# Patient Record
Sex: Female | Born: 1945 | Race: Black or African American | Hispanic: No | Smoking: Current every day smoker
Health system: Southern US, Community
[De-identification: ages and names within clinical notes are randomized; demographics above are authoritative.]

## PROBLEM LIST (undated history)

## (undated) DIAGNOSIS — I4891 Unspecified atrial fibrillation: Secondary | ICD-10-CM

## (undated) DIAGNOSIS — I420 Dilated cardiomyopathy: Secondary | ICD-10-CM

## (undated) DIAGNOSIS — I82409 Acute embolism and thrombosis of unspecified deep veins of unspecified lower extremity: Secondary | ICD-10-CM

## (undated) DIAGNOSIS — I10 Essential (primary) hypertension: Secondary | ICD-10-CM

## (undated) DIAGNOSIS — E119 Type 2 diabetes mellitus without complications: Secondary | ICD-10-CM

## (undated) DIAGNOSIS — I2510 Atherosclerotic heart disease of native coronary artery without angina pectoris: Secondary | ICD-10-CM

## (undated) DIAGNOSIS — K921 Melena: Secondary | ICD-10-CM

## (undated) DIAGNOSIS — E785 Hyperlipidemia, unspecified: Secondary | ICD-10-CM

## (undated) DIAGNOSIS — G4733 Obstructive sleep apnea (adult) (pediatric): Secondary | ICD-10-CM

## (undated) DIAGNOSIS — Z95810 Presence of automatic (implantable) cardiac defibrillator: Secondary | ICD-10-CM

## (undated) DIAGNOSIS — K219 Gastro-esophageal reflux disease without esophagitis: Secondary | ICD-10-CM

## (undated) DIAGNOSIS — I509 Heart failure, unspecified: Secondary | ICD-10-CM

## (undated) DIAGNOSIS — G40909 Epilepsy, unspecified, not intractable, without status epilepticus: Secondary | ICD-10-CM

## (undated) HISTORY — DX: Melena: K92.1

---

## 2013-03-19 ENCOUNTER — Inpatient Hospital Stay (HOSPITAL_COMMUNITY)
Admission: EM | Admit: 2013-03-19 | Discharge: 2013-03-25 | DRG: 302 | Disposition: A | Discharge status: Home / Self Care | Payer: Medicare Other | Attending: Internal Medicine | Admitting: Internal Medicine

## 2013-03-19 ENCOUNTER — Emergency Department (HOSPITAL_COMMUNITY): Payer: Medicare Other

## 2013-03-19 ENCOUNTER — Encounter (HOSPITAL_COMMUNITY): Payer: Self-pay | Admitting: Emergency Medicine

## 2013-03-19 DIAGNOSIS — E785 Hyperlipidemia, unspecified: Secondary | ICD-10-CM | POA: Diagnosis present

## 2013-03-19 DIAGNOSIS — G40909 Epilepsy, unspecified, not intractable, without status epilepticus: Secondary | ICD-10-CM | POA: Diagnosis present

## 2013-03-19 DIAGNOSIS — Z720 Tobacco use: Secondary | ICD-10-CM | POA: Diagnosis present

## 2013-03-19 DIAGNOSIS — E872 Acidosis, unspecified: Secondary | ICD-10-CM | POA: Diagnosis present

## 2013-03-19 DIAGNOSIS — F17200 Nicotine dependence, unspecified, uncomplicated: Secondary | ICD-10-CM | POA: Diagnosis present

## 2013-03-19 DIAGNOSIS — I5023 Acute on chronic systolic (congestive) heart failure: Secondary | ICD-10-CM | POA: Diagnosis present

## 2013-03-19 DIAGNOSIS — N186 End stage renal disease: Secondary | ICD-10-CM | POA: Diagnosis present

## 2013-03-19 DIAGNOSIS — E662 Morbid (severe) obesity with alveolar hypoventilation: Secondary | ICD-10-CM

## 2013-03-19 DIAGNOSIS — R45851 Suicidal ideations: Secondary | ICD-10-CM

## 2013-03-19 DIAGNOSIS — G4733 Obstructive sleep apnea (adult) (pediatric): Secondary | ICD-10-CM | POA: Diagnosis present

## 2013-03-19 DIAGNOSIS — I509 Heart failure, unspecified: Secondary | ICD-10-CM | POA: Diagnosis present

## 2013-03-19 DIAGNOSIS — Z91199 Patient's noncompliance with other medical treatment and regimen due to unspecified reason: Secondary | ICD-10-CM

## 2013-03-19 DIAGNOSIS — Z86718 Personal history of other venous thrombosis and embolism: Secondary | ICD-10-CM

## 2013-03-19 DIAGNOSIS — I4891 Unspecified atrial fibrillation: Secondary | ICD-10-CM | POA: Diagnosis present

## 2013-03-19 DIAGNOSIS — I428 Other cardiomyopathies: Secondary | ICD-10-CM | POA: Diagnosis present

## 2013-03-19 DIAGNOSIS — I2510 Atherosclerotic heart disease of native coronary artery without angina pectoris: Principal | ICD-10-CM | POA: Diagnosis present

## 2013-03-19 DIAGNOSIS — Z95810 Presence of automatic (implantable) cardiac defibrillator: Secondary | ICD-10-CM

## 2013-03-19 DIAGNOSIS — G9340 Encephalopathy, unspecified: Secondary | ICD-10-CM | POA: Diagnosis present

## 2013-03-19 DIAGNOSIS — R0689 Other abnormalities of breathing: Secondary | ICD-10-CM

## 2013-03-19 DIAGNOSIS — R079 Chest pain, unspecified: Secondary | ICD-10-CM

## 2013-03-19 DIAGNOSIS — Z6841 Body Mass Index (BMI) 40.0 and over, adult: Secondary | ICD-10-CM

## 2013-03-19 DIAGNOSIS — J4489 Other specified chronic obstructive pulmonary disease: Secondary | ICD-10-CM | POA: Diagnosis present

## 2013-03-19 DIAGNOSIS — I420 Dilated cardiomyopathy: Secondary | ICD-10-CM

## 2013-03-19 DIAGNOSIS — Z794 Long term (current) use of insulin: Secondary | ICD-10-CM

## 2013-03-19 DIAGNOSIS — F22 Delusional disorders: Secondary | ICD-10-CM | POA: Diagnosis present

## 2013-03-19 DIAGNOSIS — J449 Chronic obstructive pulmonary disease, unspecified: Secondary | ICD-10-CM | POA: Diagnosis present

## 2013-03-19 DIAGNOSIS — I208 Other forms of angina pectoris: Secondary | ICD-10-CM | POA: Diagnosis present

## 2013-03-19 DIAGNOSIS — I200 Unstable angina: Secondary | ICD-10-CM | POA: Diagnosis present

## 2013-03-19 DIAGNOSIS — I120 Hypertensive chronic kidney disease with stage 5 chronic kidney disease or end stage renal disease: Secondary | ICD-10-CM | POA: Diagnosis present

## 2013-03-19 DIAGNOSIS — F332 Major depressive disorder, recurrent severe without psychotic features: Secondary | ICD-10-CM | POA: Diagnosis present

## 2013-03-19 DIAGNOSIS — I10 Essential (primary) hypertension: Secondary | ICD-10-CM | POA: Diagnosis present

## 2013-03-19 DIAGNOSIS — I2089 Other forms of angina pectoris: Secondary | ICD-10-CM | POA: Diagnosis present

## 2013-03-19 DIAGNOSIS — I252 Old myocardial infarction: Secondary | ICD-10-CM

## 2013-03-19 DIAGNOSIS — I219 Acute myocardial infarction, unspecified: Secondary | ICD-10-CM | POA: Insufficient documentation

## 2013-03-19 DIAGNOSIS — Z5900 Homelessness unspecified: Secondary | ICD-10-CM

## 2013-03-19 DIAGNOSIS — J9620 Acute and chronic respiratory failure, unspecified whether with hypoxia or hypercapnia: Secondary | ICD-10-CM

## 2013-03-19 DIAGNOSIS — Z9119 Patient's noncompliance with other medical treatment and regimen: Secondary | ICD-10-CM

## 2013-03-19 DIAGNOSIS — E119 Type 2 diabetes mellitus without complications: Secondary | ICD-10-CM | POA: Diagnosis present

## 2013-03-19 DIAGNOSIS — K219 Gastro-esophageal reflux disease without esophagitis: Secondary | ICD-10-CM | POA: Diagnosis present

## 2013-03-19 DIAGNOSIS — E876 Hypokalemia: Secondary | ICD-10-CM | POA: Diagnosis present

## 2013-03-19 HISTORY — DX: Epilepsy, unspecified, not intractable, without status epilepticus: G40.909

## 2013-03-19 HISTORY — DX: Acute embolism and thrombosis of unspecified deep veins of unspecified lower extremity: I82.409

## 2013-03-19 HISTORY — DX: Atherosclerotic heart disease of native coronary artery without angina pectoris: I25.10

## 2013-03-19 HISTORY — DX: Unspecified atrial fibrillation: I48.91

## 2013-03-19 HISTORY — DX: Hyperlipidemia, unspecified: E78.5

## 2013-03-19 HISTORY — DX: Dilated cardiomyopathy: I42.0

## 2013-03-19 HISTORY — DX: Presence of automatic (implantable) cardiac defibrillator: Z95.810

## 2013-03-19 HISTORY — DX: Heart failure, unspecified: I50.9

## 2013-03-19 HISTORY — DX: Obstructive sleep apnea (adult) (pediatric): G47.33

## 2013-03-19 HISTORY — DX: Essential (primary) hypertension: I10

## 2013-03-19 HISTORY — DX: Type 2 diabetes mellitus without complications: E11.9

## 2013-03-19 HISTORY — DX: Gastro-esophageal reflux disease without esophagitis: K21.9

## 2013-03-19 LAB — GLUCOSE, CAPILLARY: Glucose-Capillary: 88 mg/dL (ref 70–99)

## 2013-03-19 LAB — CBC WITH DIFFERENTIAL/PLATELET
Basophils Absolute: 0 10*3/uL (ref 0.0–0.1)
Lymphocytes Relative: 28 % (ref 12–46)
Lymphs Abs: 1.3 10*3/uL (ref 0.7–4.0)
MCV: 84.7 fL (ref 78.0–100.0)
Neutro Abs: 2.7 10*3/uL (ref 1.7–7.7)
Neutrophils Relative %: 60 % (ref 43–77)
Platelets: 140 10*3/uL — ABNORMAL LOW (ref 150–400)
RBC: 4.25 MIL/uL (ref 3.87–5.11)
RDW: 16.1 % — ABNORMAL HIGH (ref 11.5–15.5)
WBC: 4.5 10*3/uL (ref 4.0–10.5)

## 2013-03-19 LAB — PROTIME-INR: Prothrombin Time: 12.8 seconds (ref 11.6–15.2)

## 2013-03-19 LAB — COMPREHENSIVE METABOLIC PANEL
ALT: 7 U/L (ref 0–35)
AST: 16 U/L (ref 0–37)
Alkaline Phosphatase: 52 U/L (ref 39–117)
CO2: 23 mEq/L (ref 19–32)
Calcium: 9 mg/dL (ref 8.4–10.5)
Chloride: 106 mEq/L (ref 96–112)
GFR calc Af Amer: 71 mL/min — ABNORMAL LOW (ref >90)
GFR calc non Af Amer: 61 mL/min — ABNORMAL LOW (ref >90)
Glucose, Bld: 169 mg/dL — ABNORMAL HIGH (ref 70–99)
Potassium: 4 mEq/L (ref 3.5–5.1)
Sodium: 139 mEq/L (ref 135–145)

## 2013-03-19 LAB — MAGNESIUM: Magnesium: 1.9 mg/dL (ref 1.5–2.5)

## 2013-03-19 LAB — POCT I-STAT TROPONIN I: Troponin i, poc: 0.03 ng/mL (ref 0.00–0.08)

## 2013-03-19 MED ORDER — ACETAZOLAMIDE 250 MG PO TABS
250.0000 mg | ORAL_TABLET | Freq: Three times a day (TID) | ORAL | Status: DC
  Administered 2013-03-19 – 2013-03-21 (×4): 250 mg via ORAL
  Filled 2013-03-19 (×7): qty 1

## 2013-03-19 MED ORDER — ASPIRIN EC 81 MG PO TBEC
81.0000 mg | DELAYED_RELEASE_TABLET | Freq: Every day | ORAL | Status: DC
  Administered 2013-03-20 – 2013-03-25 (×6): 81 mg via ORAL
  Filled 2013-03-19 (×6): qty 1

## 2013-03-19 MED ORDER — ASPIRIN 325 MG PO TABS: 325.0000 mg | ORAL_TABLET | Freq: Once | ORAL | Status: DC

## 2013-03-19 MED ORDER — NICOTINE 14 MG/24HR TD PT24
14.0000 mg | MEDICATED_PATCH | Freq: Every day | TRANSDERMAL | Status: DC | PRN
  Filled 2013-03-19: qty 1

## 2013-03-19 MED ORDER — ATORVASTATIN CALCIUM 80 MG PO TABS
80.0000 mg | ORAL_TABLET | Freq: Every day | ORAL | Status: DC
  Administered 2013-03-21 – 2013-03-24 (×4): 80 mg via ORAL
  Filled 2013-03-19 (×6): qty 1

## 2013-03-19 MED ORDER — INSULIN ASPART 100 UNIT/ML ~~LOC~~ SOLN
0.0000 [IU] | Freq: Three times a day (TID) | SUBCUTANEOUS | Status: DC
  Administered 2013-03-20 – 2013-03-25 (×5): 2 [IU] via SUBCUTANEOUS

## 2013-03-19 MED ORDER — HEPARIN BOLUS VIA INFUSION
4000.0000 [IU] | Freq: Once | INTRAVENOUS | Status: AC
  Administered 2013-03-19: 4000 [IU] via INTRAVENOUS
  Filled 2013-03-19: qty 4000

## 2013-03-19 MED ORDER — ONDANSETRON HCL 4 MG/2ML IJ SOLN: 4.0000 mg | Freq: Four times a day (QID) | INTRAMUSCULAR | Status: DC | PRN

## 2013-03-19 MED ORDER — SODIUM CHLORIDE 0.9 % IV SOLN
INTRAVENOUS | Status: DC
  Administered 2013-03-19: 10 mL/h via INTRAVENOUS
  Administered 2013-03-19: 15 mL/h via INTRAVENOUS

## 2013-03-19 MED ORDER — HEPARIN (PORCINE) IN NACL 100-0.45 UNIT/ML-% IJ SOLN
1500.0000 [IU]/h | INTRAMUSCULAR | Status: DC
  Administered 2013-03-19: 1000 [IU]/h via INTRAVENOUS
  Administered 2013-03-20: 1500 [IU]/h via INTRAVENOUS
  Filled 2013-03-19 (×3): qty 250

## 2013-03-19 MED ORDER — CARVEDILOL 25 MG PO TABS
25.0000 mg | ORAL_TABLET | Freq: Two times a day (BID) | ORAL | Status: DC
  Administered 2013-03-19 – 2013-03-25 (×11): 25 mg via ORAL
  Filled 2013-03-19 (×16): qty 1

## 2013-03-19 MED ORDER — FUROSEMIDE 10 MG/ML IJ SOLN
40.0000 mg | Freq: Two times a day (BID) | INTRAMUSCULAR | Status: DC
  Administered 2013-03-20 – 2013-03-23 (×6): 40 mg via INTRAVENOUS
  Filled 2013-03-19 (×9): qty 4

## 2013-03-19 MED ORDER — LEVETIRACETAM 500 MG PO TABS
1000.0000 mg | ORAL_TABLET | Freq: Two times a day (BID) | ORAL | Status: DC
  Administered 2013-03-19 – 2013-03-25 (×12): 1000 mg via ORAL
  Filled 2013-03-19 (×13): qty 2

## 2013-03-19 MED ORDER — ACETAMINOPHEN 325 MG PO TABS
650.0000 mg | ORAL_TABLET | ORAL | Status: DC | PRN
  Administered 2013-03-21 (×2): 650 mg via ORAL
  Filled 2013-03-19: qty 2

## 2013-03-19 MED ORDER — INSULIN ASPART 100 UNIT/ML ~~LOC~~ SOLN: 0.0000 [IU] | Freq: Every day | SUBCUTANEOUS | Status: DC

## 2013-03-19 MED ORDER — PANTOPRAZOLE SODIUM 40 MG PO TBEC
40.0000 mg | DELAYED_RELEASE_TABLET | Freq: Every day | ORAL | Status: DC
  Administered 2013-03-20 – 2013-03-25 (×6): 40 mg via ORAL
  Filled 2013-03-19 (×6): qty 1

## 2013-03-19 MED ORDER — ASPIRIN 81 MG PO CHEW: 324.0000 mg | CHEWABLE_TABLET | ORAL | Status: DC

## 2013-03-19 MED ORDER — ASPIRIN 300 MG RE SUPP: 300.0000 mg | RECTAL | Status: DC

## 2013-03-19 NOTE — ED Notes (Signed)
Internal Medicine Consult at bedside for evaluation.
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
## 2013-03-19 NOTE — ED Notes (Signed)
Courtney RN informed that pt needed a Comptroller.  House coverage reports that there is not a sitter available and EMT/NA needs to be used.  Courtney RN made aware.

## 2013-03-19 NOTE — ED Notes (Signed)
MD Allen at bedside for evaluation

## 2013-03-19 NOTE — ED Notes (Signed)
Report attempted to be called to [facility].  Reported that they are unable to take report at this time and will call when ready.

## 2013-03-19 NOTE — ED Notes (Signed)
Pt belongings removed and placed at [facility] [employer].- clothes only

## 2013-03-19 NOTE — Progress Notes (Signed)
ANTICOAGULATION CONSULT NOTE - Initial Consult  Pharmacy Consult for Heparin Indication: chest pain/ACS  Allergies  Allergen Reactions  . Hydrogen Peroxide Rash  . Iodine Rash  . Nitroglycerin Other (See Comments)    Headache only  . Penicillins Anxiety    "makes me paranoid, like I'm on marijuana"  . Phenobarbital Rash    Patient Measurements:  62 in 127.7kg IBW: 50.1 kg HDW: 82 kg  Vital Signs: Temp: 97.9 F (36.6 C) (08/28 1409) Temp src: Oral (08/28 1409) BP: 121/60 mmHg (08/28 1815) Pulse Rate: 63 (08/28 1815)  Labs:  Recent Labs  03/19/13 1534  HGB 11.0*  HCT 36.0  PLT 140*  CREATININE 0.93    CrCl is unknown because there is no height on file for the current visit.   Medical History: Past Medical History  Diagnosis Date  . Atrial fibrillation     Pt denies history of this, unclear why this is on patient's problem list  . Hypertension   . Diabetes mellitus without complication   . CAD (coronary artery disease)     History of 2 prior MI's, the most recent of which was in 2013  . Hyperlipemia   . GERD (gastroesophageal reflux disease)   . DVT (deep venous thrombosis)     History of 1 DVT around 2012, unclear if provoked or unprovoked. Was previously on Coumadin, but this was stopped due to a GI bleed  . Congestive heart failure     With ICD in place since 05/2010  . Epilepsy   . Obstructive sleep apnea     Assessment: -67 y/o F to start heparin for CP/ACS  -Noted DVT on warfarin in the past, but stopped due to GI bleed years ago -No overt bleeding issues noted now -Hgb 11, Plts low at 140, Scr 0.93 -Will order INR, aPTT, will not delay heparin start  Goal of Therapy:  Heparin level 0.3-0.7 units/ml Monitor platelets by anticoagulation protocol: Yes   Plan:  -Heparin 4000 units BOLUS x 1 -Start heparin drip at 1000 units/hr -Check 8 hour HL at 0330 -Daily CBC/HL -Monitor for bleeding  Thank you for allowing me to take part in this  patient's care,  Abran Duke, PharmD Clinical Pharmacist Phone: [phone] Pager: [phone] 03/19/2013 7:03 PM

## 2013-03-19 NOTE — ED Provider Notes (Signed)
CSN: [id number]     Arrival date & time 03/19/13  1357 History   First MD Initiated Contact with Patient 03/19/13 1414     Chief Complaint  Patient presents with  . Emesis  . Cough  . Chest Pain   (Consider location/radiation/quality/duration/timing/severity/associated sxs/prior Treatment) Patient is a 67 y.o. female presenting with vomiting, cough, and chest pain. The history is provided by the patient.  Emesis Cough Associated symptoms: chest pain   Chest Pain Associated symptoms: cough and vomiting    patient here with a three-week history of intermittent chest pain lasting from minutes to hours associated with exertion and eating. History of MI in the past and isn't sure if this is similar. Patient denies any fever does note some cough. No medications as prior to arrival. He says his symptoms have become worse. Denies any syncope or near-syncope. History of MI was 3 years ago  Past Medical History  Diagnosis Date  . Atrial fibrillation   . Hypertension   . Diabetes mellitus without complication   . MI (myocardial infarction)   . Hyperlipemia   . GERD (gastroesophageal reflux disease)   . Asthma    History reviewed. No pertinent past surgical history. History reviewed. No pertinent family history. History  Substance Use Topics  . Smoking status: Current Every Day Smoker  . Smokeless tobacco: Not on file  . Alcohol Use: No   OB History   Grav Para Term Preterm Abortions TAB SAB Ect Mult Living                 Review of Systems  Respiratory: Positive for cough.   Cardiovascular: Positive for chest pain.  Gastrointestinal: Positive for vomiting.  All other systems reviewed and are negative.    Allergies  Hydrogen peroxide; Iodine; Nitroglycerin; Penicillins; and Phenobarbital  Home Medications   Current Outpatient Rx  Name  Route  Sig  Dispense  Refill  . acetaZOLAMIDE (DIAMOX) 250 MG tablet   Oral   Take 250 mg by mouth 3 (three) times daily.         Marland Kitchen  aspirin 81 MG tablet   Oral   Take 81 mg by mouth daily.         . carvedilol (COREG) 25 MG tablet   Oral   Take 25 mg by mouth 2 (two) times daily with a meal.         . doxycycline (DORYX) 100 MG DR capsule   Oral   Take 100 mg by mouth 2 (two) times daily.         . furosemide (LASIX) 40 MG tablet   Oral   Take 40 mg by mouth.         . hydrALAZINE (APRESOLINE) 25 MG tablet   Oral   Take 25 mg by mouth 3 (three) times daily.         . insulin glargine (LANTUS) 100 UNIT/ML injection   Subcutaneous   Inject into the skin at bedtime.         . levETIRAcetam (KEPPRA) 1000 MG tablet   Oral   Take 1,000 mg by mouth 2 (two) times daily.         Marland Kitchen linagliptin (TRADJENTA) 5 MG TABS tablet   Oral   Take 5 mg by mouth daily.         Marland Kitchen losartan (COZAAR) 25 MG tablet   Oral   Take 25 mg by mouth daily.         Marland Kitchen  meloxicam (MOBIC) 15 MG tablet   Oral   Take 15 mg by mouth daily.         . potassium chloride (MICRO-K) 10 MEQ CR capsule   Oral   Take 10 mEq by mouth 2 (two) times daily.         . rosuvastatin (CRESTOR) 20 MG tablet   Oral   Take 20 mg by mouth daily.          BP 103/40  Pulse 70  Temp(Src) 97.9 F (36.6 C) (Oral)  Resp 24  SpO2 93% Physical Exam  Nursing note and vitals reviewed. Constitutional: She is oriented to person, place, and time. She appears well-developed and well-nourished.  Non-toxic appearance. No distress.  HENT:  Head: Normocephalic and atraumatic.  Eyes: Conjunctivae, EOM and lids are normal. Pupils are equal, round, and reactive to light.  Neck: Normal range of motion. Neck supple. No tracheal deviation present. No mass present.  Cardiovascular: Normal rate, regular rhythm and normal heart sounds.  Exam reveals no gallop.   No murmur heard. Pulmonary/Chest: Effort normal. No stridor. No respiratory distress. She has decreased breath sounds. She has no wheezes. She has no rhonchi. She has no rales.   Abdominal: Soft. Normal appearance and bowel sounds are normal. She exhibits no distension. There is no tenderness. There is no rebound and no CVA tenderness.  Musculoskeletal: Normal range of motion. She exhibits no edema and no tenderness.  Neurological: She is alert and oriented to person, place, and time. She has normal strength. No cranial nerve deficit or sensory deficit. GCS eye subscore is 4. GCS verbal subscore is 5. GCS motor subscore is 6.  Skin: Skin is warm and dry. No abrasion and no rash noted.  Psychiatric: Her speech is normal. Her affect is blunt. She is withdrawn.    ED Course  Procedures (including critical care time) Labs Review Labs Reviewed  CBC WITH DIFFERENTIAL  COMPREHENSIVE METABOLIC PANEL  LIPASE, BLOOD  PRO B NATRIURETIC PEPTIDE   Imaging Review No results found.  MDM  No diagnosis found.  Date: 03/19/2013  Rate: 98  Rhythm: normal sinus rhythm  QRS Axis: normal  Intervals: QT prolonged  ST/T Wave abnormalities: nonspecific ST changes  Conduction Disutrbances:none  Narrative Interpretation:   Old EKG Reviewed: none available    Pt to be admitted for evaluation of chest pain  Toy Baker, MD 03/25/13 1620

## 2013-03-19 NOTE — H&P (Signed)
Date: 03/19/2013               Patient Name:  Kelly Phelps MRN: [id number]  DOB: 03/31/1946 Age / Sex: 67 y.o., female   PCP: No primary provider on file.         Medical Service: Internal Medicine Teaching Service         Attending Physician: Dr. Debe Coder     First Contact: Dr. Johna Roles Pager: [phone]  Second Contact: Dr. Dierdre Searles  Pager: [phone]       After Hours (After 5p/  First Contact Pager: [phone]  weekends / holidays): Second Contact Pager: [phone]   Chief Complaint: chest tightness and pain History of Present Illness:   Patient is a 67 yo AA female with pmh of hypertension, T2DM, HL, epilepsy, afib (on aspirin),  CHF with AICD placement Nov 2011,  CAD s/p MI's,  sleep apnea, and tobacco use who presents with substernal chest pain and tightness  for past 5 days that has worsened 2 days ago.  Pt describes the substernal chest pain as squeezing with the feeling of something sitting on her chest. Pain is 10/10 and radiates to left arm with associated diaphoresis, weakness, palpitations, dyspnea, cough, nausea, and vomiting.  Pain is worse with activity such as walking and climbing stairs and improves with rest. Episode can  last for about up to an 1 hour. She has been having these episodes for some time but in the past 5 days they have been occuring at rest. Symptoms are not worsened by food or deep breathing.     Patient has past history of cardiac disease with MI in 1979 and latest in Jan 2013. She has had exercise stress testing in past but no stent placement. Family history significant for MI in mother (died at age 30), sister(age 24), and daughter (age 14).     She has history of hypertension, hyperlipidemia, type 2 diabetes, and tobacco use (1 pack a week since age 59). She also has history CHF with  AICD in place since Nov of 2013. She admits that due to problem with obtaining medications due to homelessness, she has not been on diuretic therapy for past couple of months and  noticed increased lower extremity swelling. She reports for the past 4-5 months she has been sleeping upright on 5-6 pillows with orthopnea and PND for past 1-2 months.      She has history of afib and previously on coumadin but stopped due to rectal bleeding. She is currently on aspirin therapy for anticoagulation.       Meds: Current Facility-Administered Medications  Medication Dose Route Frequency Provider Last Rate Last Dose  . 0.9 %  sodium chloride infusion   Intravenous Continuous Toy Baker, MD 15 mL/hr at 03/19/13 1429 15 mL/hr at 03/19/13 1429   Current Outpatient Prescriptions  Medication Sig Dispense Refill  . acetaZOLAMIDE (DIAMOX) 250 MG tablet Take 250 mg by mouth 3 (three) times daily.      Marland Kitchen aspirin 81 MG tablet Take 81 mg by mouth daily.      . carvedilol (COREG) 25 MG tablet Take 25 mg by mouth 2 (two) times daily with a meal.      . DULoxetine (CYMBALTA) 60 MG capsule Take 60 mg by mouth daily.      . furosemide (LASIX) 40 MG tablet Take 40 mg by mouth.      . hydrALAZINE (APRESOLINE) 25 MG tablet Take 25 mg by mouth 3 (three) times  daily.      . insulin glargine (LANTUS) 100 UNIT/ML injection Inject 26 Units into the skin at bedtime.       . levETIRAcetam (KEPPRA) 1000 MG tablet Take 1,000 mg by mouth 2 (two) times daily.      Marland Kitchen linagliptin (TRADJENTA) 5 MG TABS tablet Take 5 mg by mouth daily.      Marland Kitchen losartan (COZAAR) 25 MG tablet Take 25 mg by mouth daily.      . meloxicam (MOBIC) 15 MG tablet Take 15 mg by mouth daily.      Marland Kitchen omeprazole (PRILOSEC) 20 MG capsule Take 20 mg by mouth daily.      . potassium chloride (MICRO-K) 10 MEQ CR capsule Take 10 mEq by mouth 2 (two) times daily.      . rosuvastatin (CRESTOR) 20 MG tablet Take 20 mg by mouth daily.      Marland Kitchen aspirin 325 MG tablet Take 325 mg by mouth daily.        Allergies: Allergies as of 03/19/2013 - Review Complete 03/19/2013  Allergen Reaction Noted  . Hydrogen peroxide Rash 03/19/2013  . Iodine  Rash 03/19/2013  . Nitroglycerin Other (See Comments) 03/19/2013  . Penicillins Anxiety 03/19/2013  . Phenobarbital Rash 03/19/2013   Past Medical History  Diagnosis Date  . Atrial fibrillation     Pt denies history of this, unclear why this is on patient's problem list  . Hypertension   . Diabetes mellitus without complication   . CAD (coronary artery disease)     History of 2 prior MI's, the most recent of which was in 2013  . Hyperlipemia   . GERD (gastroesophageal reflux disease)   . DVT (deep venous thrombosis)     History of 1 DVT around 2012, unclear if provoked or unprovoked. Was previously on Coumadin, but this was stopped due to a GI bleed  . Congestive heart failure     With ICD in place since 05/2010  . Epilepsy   . Obstructive sleep apnea    History reviewed. No pertinent past surgical history. Family History  Problem Relation Age of Onset  . Heart attack Daughter 56  . Heart attack Sister 65  . Heart attack Mother 85  . Renal Disease Sister     ESRD, on HD  . Heart failure Daughter    History   Social History  . Marital Status: Widowed    Spouse Name: N/A    Number of Children: N/A  . Years of Education: N/A   Occupational History  . Not on file.   Social History Main Topics  . Smoking status: Current Every Day Smoker -- 0.20 packs/day for 56 years  . Smokeless tobacco: Not on file  . Alcohol Use: No     Comment: History of alcohol abuse, quit in 1986  . Drug Use: No  . Sexual Activity: Not on file   Other Topics Concern  . 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  Now on disability (for CHF?), homeless, living with niece-in-law.  Recently moved to [place], previously received her medical care at [facility], in [place], [place].    Review of Systems: Review of Systems  Constitutional: Positive for malaise/fatigue and diaphoresis. Negative for fever.  Respiratory:  Positive for shortness of breath. Negative for cough.   Cardiovascular: Positive for chest pain, palpitations, orthopnea, leg swelling and PND.  Gastrointestinal: Positive for nausea, vomiting,  abdominal pain and constipation.  Neurological: Positive for dizziness and weakness. Negative for headaches.  Psychiatric/Behavioral: Positive for depression.    Physical Exam: Blood pressure 125/56, pulse 62, temperature 97.9 F (36.6 C), temperature source Oral, resp. rate 14, SpO2 98.00%. Constitutional: She is oriented to person, place, and time. She appears well-developed and well-nourished. Non-toxic appearance.Lying in bed, in moderate distress due to pain, obese  Head: Normocephalic and atraumatic.  Eyes: Conjunctivae, EOM and lids are normal.  Neck: Normal range of motion. Neck supple. No JVD   Cardiovascular: Normal rate, regular rhythm and normal heart sounds. Exam reveals no gallop.  No murmur heard.   Pulmonary/Chest: Effort normal. No stridor. No respiratory distress. She has decreased air movement  She has no wheezes. She has no rhonchi. She has no rales.  Abdominal: Soft. Normal appearance and bowel sounds are normal. She exhibits no distension. Epigastric tenderness. There is no rebound.   Musculoskeletal: Normal range of motion. She exhibits +1/2 non pitting edema in lower extremities bilaterally   Neurological: She is alert and oriented to person, place, and time. She has normal strength. No cranial nerve deficit or sensory deficit.  Skin: Skin is warm and dry. No abrasion and no rash noted.  Psychiatric: Her speech is not clear, difficulty understanding, under narcotics   Lab results: Basic Metabolic Panel:  Recent Labs  09/81/19 1534  NA 139  K 4.0  CL 106  CO2 23  GLUCOSE 169*  BUN 16  CREATININE 0.93  CALCIUM 9.0   Liver Function Tests:  Recent Labs  03/19/13 1534  AST 16  ALT 7  ALKPHOS 52  BILITOT 0.4  PROT 6.6  ALBUMIN 3.2*    Recent Labs   03/19/13 1534  LIPASE 48   No results found for this basename: AMMONIA,  in the last 72 hours CBC:  Recent Labs  03/19/13 1534  WBC 4.5  NEUTROABS 2.7  HGB 11.0*  HCT 36.0  MCV 84.7  PLT 140*   Cardiac Enzymes: No results found for this basename: CKTOTAL, CKMB, CKMBINDEX, TROPONINI,  in the last 72 hours BNP:  Recent Labs  03/19/13 1535  PROBNP 910.9*   D-Dimer: No results found for this basename: DDIMER,  in the last 72 hours CBG: No results found for this basename: GLUCAP,  in the last 72 hours Hemoglobin A1C: No results found for this basename: HGBA1C,  in the last 72 hours Fasting Lipid Panel: No results found for this basename: CHOL, HDL, LDLCALC, TRIG, CHOLHDL, LDLDIRECT,  in the last 72 hours Thyroid Function Tests: No results found for this basename: TSH, T4TOTAL, FREET4, T3FREE, THYROIDAB,  in the last 72 hours Anemia Panel: No results found for this basename: VITAMINB12, FOLATE, FERRITIN, TIBC, IRON, RETICCTPCT,  in the last 72 hours Coagulation: No results found for this basename: LABPROT, INR,  in the last 72 hours Urine Drug Screen: Drugs of Abuse  No results found for this basename: labopia, cocainscrnur, labbenz, amphetmu, thcu, labbarb    Alcohol Level: No results found for this basename: ETH,  in the last 72 hours Urinalysis: No results found for this basename: COLORURINE, APPERANCEUR, LABSPEC, PHURINE, GLUCOSEU, HGBUR, BILIRUBINUR, KETONESUR, PROTEINUR, UROBILINOGEN, NITRITE, LEUKOCYTESUR,  in the last 72 hours Misc. Labs:   Imaging results:  Dg Chest 2 View  03/19/2013   *RADIOLOGY REPORT*  Clinical Data: Chest pain  CHEST - 2 VIEW  Comparison: None.  Findings: Cardiac shadow is enlarged.  A defibrillator is noted. The lungs are clear.  No  acute bony abnormality is noted.  IMPRESSION: No acute abnormalities seen.   Original Report Authenticated By: Alcide Clever, M.D.    Other results: No diagnosis found.  Date: 03/19/2013  Rate: 72   Rhythm: normal sinus rhythm  QRS Axis: normal  Intervals: QT prolonged  ST/T Wave abnormalities: nonspecific ST changes, TWI in inferolateral leads  Conduction Disutrbances:none  Narrative Interpretation:  Old EKG Reviewed: none available   Assessment & Plan by Problem: Principal Problem:   Anginal chest pain at rest Active Problems:   Hypertension   Diabetes mellitus without complication   Tobacco abuse   Congestive heart failure  The patient is a 67 yo woman, history of CAD s/p MI's, CHF s/p ICD, HTN, HL, FH of MI, presenting with chest pain.   # Anginal Chest pain - The patient presents with substernal chest pain, worsened by exertion, previously improved by rest, now occurring at rest. Troponin negative, EKG shows TWI in inferolateral leads (?new vs old). The patient has risk factors of HTN, HL, prior MI, FH, as well as a TIMI score of 5 (high risk, for aspirin use, 2 anginal episodes, age, CAD risk factors, and history of CAD, +/- EKG changes if TWI are new). As such, this patient is at high risk for ACS. Differential also includes CHF exacerbation (moderate pro-BNP elevation, LE edema, orthopnea, PND). Less likely possibilities include GERD vs MSK. Unlikely PE (Wells score = 1.5, low prob).  -start heparin drip  -give aspirin, atorvastatin, coreg  -cycle troponins x3, check EKG in AM  -consulted cardiology, given high risk for ACS, for consideration for cardiac cath   # Acute on Chronic CHF - The patient has a history of CHF, and presents with moderately elevated pro-BNP (910), 5-pillow orthopnea, PND, and LE edema, in the setting of lasix non-compliance. This likely represents a mild CHF exacerbation.  -start lasix IV 40 BID  -continue coreg due to concern for ACS  -losartan and hydralazine (no nitrate, allergy) on home med list, but pt notes non-compliance. BP borderline low. Will start coreg first, and add ARB if BP tolerates   # DM2 - The patient has a history of DM. CBG  169 on admission, unclear if patient compliant with meds, though previously on Lantus 26, linagliptin.  -start SSI moderate, can add Lantus depending on glucose control   # OSA - pt notes non-compliance with CPAP at home  -CPAP qhs while hospitalized   # Homelessness - living with niece-in-law, unable to afford medications  -consult CSW, CM   # Hypertension - chronic, stable  -continue coreg  -hold hydralazine, losartan for now, can re-add as BP tolerates   # Hyperlipidemia - chronic, stable  -start atorvastatin   # Tobacco abuse - encourage cessation  -ordered tobacco abuse counseling  -nicotine patch daily prn   # Epilepsy - chronic, stable. Pt states she cannot remember her last seizure  -continue AED's   # Prophy - heparin      Dispo: Disposition is deferred at this time, awaiting improvement of current medical problems. Anticipated discharge in approximately 1-2 day(s).   The patient does have a current PCP (No primary provider on file.) and does need an [facility] hospital follow-up appointment after discharge.  The patient does have transportation limitations that hinder transportation to clinic appointments.  Signed: Otis Brace, MD 03/19/2013, 6:12 PM

## 2013-03-19 NOTE — Progress Notes (Signed)
Pt arrived to 4E15 from ED.  Pt has been placed on telemetry and is NSR.  Pt vitals taken.  BP 119/56, HR 55, afeb, R 18, 95% on 4L/02 via [place].  Pt appears in no acute distress and is resting comfortably in the bed.  Pt has sitter present at bedside r/t pt expressing suicidal ideation in the ED.  Orders will be reviewed and reported off to night shift RN at this time. Nino Glow RN

## 2013-03-19 NOTE — ED Notes (Signed)
MD consultDeboraha Phelps Physician- at bedside.

## 2013-03-19 NOTE — Consult Note (Signed)
Admit date: 03/19/2013 Referring Physician: Dr. Trinda Pascal Primary Physician No primary provider on file. Primary Cardiologist  None Reason for Consultation  Chest pain  HPI: 67 year old female with prior ICD placement 2011 for presumed left ventricular dysfunction/cardiomyopathy with history of hypertension, diabetes, morbid obesity, prior DVT-anticoagulation stopped because of GI bleed, sleep apnea who is here, new to [facility], recently moved from [place] currently in the emergency room with chest pain. When I entered the room, she was crying. Distraught. She states that she has been having chest discomfort/pressure for 2-3 weeks. Reassuring point-of-care troponin was normal. EKG demonstrates diffuse T-wave inversion, possibly suggestive of dilated cardiomyopathy versus ischemia. I do not have an old EKG to compare. She describes her chest pain as a pressure. She does state that it is worse when she walks stairs. She continues to smoke she states that she is willing to quit. Chest x-ray demonstrates defibrillator. There are no clear infiltrates. X-ray is not suggestive of significant edema.  When asked about cardiac catheterization, she remembers having one done in 2011 surrounding her ICD placement. When asked if she had coronary artery disease or prior stents, she said no. Once again, I do not have prior records.    PMH:   Past Medical History  Diagnosis Date  . Atrial fibrillation     Pt denies history of this, unclear why this is on patient's problem list  . Hypertension   . Diabetes mellitus without complication   . CAD (coronary artery disease)     History of 2 prior MI's, the most recent of which was in 2013  . Hyperlipemia   . GERD (gastroesophageal reflux disease)   . DVT (deep venous thrombosis)     History of 1 DVT around 2012, unclear if provoked or unprovoked. Was previously on Coumadin, but this was stopped due to a GI bleed  . Congestive heart failure     With ICD in place since  05/2010  . Epilepsy   . Obstructive sleep apnea     PSH:  History reviewed. No pertinent past surgical history. Allergies:  Hydrogen peroxide; Iodine; Nitroglycerin; Penicillins; and Phenobarbital Prior to Admit Meds:   (Not in a hospital admission) Prior to Admission medications   Medication Sig Start Date End Date Taking? Authorizing Provider  acetaZOLAMIDE (DIAMOX) 250 MG tablet Take 250 mg by mouth 3 (three) times daily.   Yes Historical Provider, MD  aspirin 81 MG tablet Take 81 mg by mouth daily.   Yes Historical Provider, MD  carvedilol (COREG) 25 MG tablet Take 25 mg by mouth 2 (two) times daily with a meal.   Yes Historical Provider, MD  DULoxetine (CYMBALTA) 60 MG capsule Take 60 mg by mouth daily.   Yes Historical Provider, MD  furosemide (LASIX) 40 MG tablet Take 40 mg by mouth.   Yes Historical Provider, MD  hydrALAZINE (APRESOLINE) 25 MG tablet Take 25 mg by mouth 3 (three) times daily.   Yes Historical Provider, MD  insulin glargine (LANTUS) 100 UNIT/ML injection Inject 26 Units into the skin at bedtime.    Yes Historical Provider, MD  levETIRAcetam (KEPPRA) 1000 MG tablet Take 1,000 mg by mouth 2 (two) times daily.   Yes Historical Provider, MD  linagliptin (TRADJENTA) 5 MG TABS tablet Take 5 mg by mouth daily.   Yes Historical Provider, MD  losartan (COZAAR) 25 MG tablet Take 25 mg by mouth daily.   Yes Historical Provider, MD  meloxicam (MOBIC) 15 MG tablet Take 15 mg by mouth daily.  Yes Historical Provider, MD  omeprazole (PRILOSEC) 20 MG capsule Take 20 mg by mouth daily.   Yes Historical Provider, MD  potassium chloride (MICRO-K) 10 MEQ CR capsule Take 10 mEq by mouth 2 (two) times daily.   Yes Historical Provider, MD  rosuvastatin (CRESTOR) 20 MG tablet Take 20 mg by mouth daily.   Yes Historical Provider, MD  aspirin 325 MG tablet Take 325 mg by mouth daily.    Historical Provider, MD   Fam HX:    Family History  Problem Relation Age of Onset  . Heart attack  Daughter 32  . Heart attack Sister 55  . Heart attack Mother 29  . Renal Disease Sister     ESRD, on HD  . Heart failure Daughter    Social HX:    History   Social History  . Marital Status: Widowed    Spouse Name: N/A    Number of Children: N/A  . Years of Education: N/A   Occupational History  . Not on file.   Social History Main Topics  . Smoking status: Current Every Day Smoker -- 0.20 packs/day for 56 years  . Smokeless tobacco: Not on file  . Alcohol Use: No     Comment: History of alcohol abuse, quit in 1986  . Drug Use: No  . Sexual Activity: Not on file   Other Topics Concern  . 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  Now on disability (for CHF?), homeless, living with niece-in-law.  Recently moved to [place], previously received her medical care at [facility], in [place], [place].     ROS: Denies any strokelike symptoms. Positive for depression, anxiety, chest pain, recent nausea/vomiting, denies bleeding, no syncope. She states that her defibrillator malfunctioned last year but denies shock. All 11 ROS were addressed and are negative except what is stated in the HPI  Physical Exam: Blood pressure 125/56, pulse 62, temperature 97.9 F (36.6 C), temperature source Oral, resp. rate 14, SpO2 98.00%.    General: Well developed, well nourished, crying, distraught Head: Eyes PERRLA, No xanthomas. Eyes, potential Graves-like appearance, proptosis.  Normal cephalic and atramatic  Lungs:   Clear bilaterally to auscultation and percussion. Normal respiratory effort. No wheezes, no rales. Heart:   HRRR S1 S2 Pulses are 2+ & equal. Tenderness to chest wall palpation, no significant murmurs. Distant heart sounds.     No carotid bruit. No JVD.  No abdominal bruits. Abdomen: Bowel sounds are positive, abdomen soft and non-tender without masses. Obese. Msk:  Back normal. Normal strength and tone for  age. Extremities:  No clubbing, cyanosis. Chronic edema changes, 1+ edema bilateral lower extremities. Neuro: Alert and oriented X 3, non-focal, MAE x 4 GU: Deferred Rectal: Deferred Psych:  Good affect, responds appropriately    Labs:   Lab Results  Component Value Date   WBC 4.5 03/19/2013   HGB 11.0* 03/19/2013   HCT 36.0 03/19/2013   MCV 84.7 03/19/2013   PLT 140* 03/19/2013    Recent Labs Lab 03/19/13 1534  NA 139  K 4.0  CL 106  CO2 23  BUN 16  CREATININE 0.93  CALCIUM 9.0  PROT 6.6  BILITOT 0.4  ALKPHOS 52  ALT 7  AST 16  GLUCOSE 169*       Radiology:  Dg Chest 2 View  03/19/2013   *RADIOLOGY REPORT*  Clinical Data: Chest pain  CHEST - 2 VIEW  Comparison: None.  Findings: Cardiac shadow is enlarged.  A defibrillator is noted. The lungs are clear.  No acute bony abnormality is noted.  IMPRESSION: No acute abnormalities seen.   Original Report Authenticated By: Alcide Clever, M.D.   Personally viewed.  EKG:   Sinus rhythm, rate 70 to, prolonged QT interval, T-wave inversion diffusely Personally viewed.   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  1. Chest pain-possible unstable angina although point-of-care troponin thus far is normal and she has been having chest discomfort for several days. It is possible that this is a noncardiac etiology. EKG with T wave inversion, possible ischemic changes however this may be her normal EKG in the setting of dilated cardiomyopathy. It would be beneficial to obtain records from [place] to compare. Given her lengthy history of chest discomfort, it may be beneficial to exclude the possibility of pulmonary embolism given her risk factors. She has had prior DVT in the past. One could start with d-dimer. As she continues to obtain cardiac markers, it would not be unreasonable to place her  on IV heparin. Nitroglycerin paste as well. Make sure that she is on beta blocker, continue with her carvedilol. It is possible that her chest pain is noncardiac in etiology.  Based upon cardiac markers, prior historical information, we will base our decision on further testing.  2. 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 She states she has never had percutaneous intervention. Continue with of carvedilol as well as losartan. It would not be unreasonable to give her IV Lasix, BNP, 910,  is slightly elevated.  3. Status post ICD  4. Diabetes - check hemoglobin A1c.  5. Hyperlipidemia-continue with Crestor.  Donato Schultz, MD  03/19/2013  6:12 PM

## 2013-03-20 ENCOUNTER — Inpatient Hospital Stay (HOSPITAL_COMMUNITY): Payer: Medicare Other

## 2013-03-20 ENCOUNTER — Encounter (HOSPITAL_COMMUNITY): Payer: Self-pay | Admitting: Radiology

## 2013-03-20 DIAGNOSIS — F332 Major depressive disorder, recurrent severe without psychotic features: Secondary | ICD-10-CM

## 2013-03-20 DIAGNOSIS — J9600 Acute respiratory failure, unspecified whether with hypoxia or hypercapnia: Secondary | ICD-10-CM

## 2013-03-20 DIAGNOSIS — R569 Unspecified convulsions: Secondary | ICD-10-CM

## 2013-03-20 LAB — CBC
HCT: 36.9 % (ref 36.0–46.0)
MCH: 26.4 pg (ref 26.0–34.0)
MCHC: 30.6 g/dL (ref 30.0–36.0)
MCV: 86.2 fL (ref 78.0–100.0)
RDW: 16.2 % — ABNORMAL HIGH (ref 11.5–15.5)

## 2013-03-20 LAB — URINALYSIS, ROUTINE W REFLEX MICROSCOPIC
Glucose, UA: NEGATIVE mg/dL
Ketones, ur: NEGATIVE mg/dL
Leukocytes, UA: NEGATIVE
Nitrite: NEGATIVE
Protein, ur: NEGATIVE mg/dL
Urobilinogen, UA: 1 mg/dL (ref 0.0–1.0)

## 2013-03-20 LAB — BASIC METABOLIC PANEL
CO2: 23 mEq/L (ref 19–32)
Chloride: 104 mEq/L (ref 96–112)
Chloride: 101 mEq/L (ref 96–112)
Creatinine, Ser: 1.06 mg/dL (ref 0.50–1.10)
GFR calc Af Amer: 61 mL/min — ABNORMAL LOW (ref >90)
GFR calc non Af Amer: 52 mL/min — ABNORMAL LOW (ref >90)
Potassium: 4.5 mEq/L (ref 3.5–5.1)
Potassium: 3.9 mEq/L (ref 3.5–5.1)
Sodium: 137 mEq/L (ref 135–145)

## 2013-03-20 LAB — HEPARIN LEVEL (UNFRACTIONATED)
Heparin Unfractionated: <0.1 IU/mL — ABNORMAL LOW (ref 0.30–0.70)
Heparin Unfractionated: 0.61 IU/mL (ref 0.30–0.70)

## 2013-03-20 LAB — RAPID URINE DRUG SCREEN, HOSP PERFORMED
Barbiturates: NOT DETECTED
Cocaine: NOT DETECTED
Tetrahydrocannabinol: NOT DETECTED

## 2013-03-20 LAB — CBC WITH DIFFERENTIAL/PLATELET
Eosinophils Absolute: 0.1 10*3/uL (ref 0.0–0.7)
Eosinophils Relative: 3 % (ref 0–5)
Hemoglobin: 10.7 g/dL — ABNORMAL LOW (ref 12.0–15.0)
Lymphs Abs: 2 10*3/uL (ref 0.7–4.0)
MCH: 26.4 pg (ref 26.0–34.0)
MCV: 87.2 fL (ref 78.0–100.0)
Monocytes Relative: 16 % — ABNORMAL HIGH (ref 3–12)
RBC: 4.05 MIL/uL (ref 3.87–5.11)

## 2013-03-20 LAB — COMPREHENSIVE METABOLIC PANEL
Alkaline Phosphatase: 53 U/L (ref 39–117)
BUN: 19 mg/dL (ref 6–23)
Calcium: 9 mg/dL (ref 8.4–10.5)
Creatinine, Ser: 1.05 mg/dL (ref 0.50–1.10)
GFR calc Af Amer: 61 mL/min — ABNORMAL LOW (ref >90)
Glucose, Bld: 107 mg/dL — ABNORMAL HIGH (ref 70–99)
Total Protein: 6.4 g/dL (ref 6.0–8.3)

## 2013-03-20 LAB — LIPID PANEL: Cholesterol: 239 mg/dL — ABNORMAL HIGH (ref 0–200)

## 2013-03-20 LAB — GLUCOSE, CAPILLARY: Glucose-Capillary: 119 mg/dL — ABNORMAL HIGH (ref 70–99)

## 2013-03-20 LAB — TROPONIN I: Troponin I: <0.3 ng/mL (ref <0.30)

## 2013-03-20 LAB — MAGNESIUM
Magnesium: 1.8 mg/dL (ref 1.5–2.5)
Magnesium: 1.9 mg/dL (ref 1.5–2.5)

## 2013-03-20 LAB — BLOOD GAS, ARTERIAL
Drawn by: 313061
O2 Content: 2 L/min
Patient temperature: 98.2
TCO2: 29.5 mmol/L (ref 0–100)
pH, Arterial: 7.267 — ABNORMAL LOW (ref 7.350–7.450)

## 2013-03-20 LAB — HEMOGLOBIN A1C: Mean Plasma Glucose: 148 mg/dL — ABNORMAL HIGH (ref <117)

## 2013-03-20 MED ORDER — INSULIN GLARGINE 100 UNIT/ML ~~LOC~~ SOLN
25.0000 [IU] | Freq: Every day | SUBCUTANEOUS | Status: DC
  Administered 2013-03-20: 25 [IU] via SUBCUTANEOUS
  Filled 2013-03-20 (×3): qty 0.25

## 2013-03-20 MED ORDER — INSULIN GLARGINE 100 UNIT/ML ~~LOC~~ SOLN
25.0000 [IU] | Freq: Every day | SUBCUTANEOUS | Status: DC
  Filled 2013-03-20: qty 0.25

## 2013-03-20 MED ORDER — HEPARIN BOLUS VIA INFUSION
3000.0000 [IU] | Freq: Once | INTRAVENOUS | Status: AC
  Administered 2013-03-20: 3000 [IU] via INTRAVENOUS
  Filled 2013-03-20: qty 3000

## 2013-03-20 NOTE — Consult Note (Signed)
NEURO HOSPITALIST CONSULT NOTE    Reason for Consult: seizures  HPI:                                                                                                                                          Kelly Phelps is an 68 y.o. female with a past medical history significant for HTN, DM, hyperlipidemia, CAD s/p MI x 2, CHF s/p ICD placement,  atrial fibrillation, obstructive sleep apnea, and epilepsy, admitted to the hospital due to chest pain. Neurology consulted regarding patient's reported history of epilepsy. She said that she is under the care of a neurologist Dr. Satira Sark in [place] but said that she has not seen him for a while. She states that she has not taken her Keppra for at least one month. Kelly Phelps tells me that she started having seizures long time " and they found a whole in my head". Said that had been treated with dilantin, phenobarbital, and keppra. According to patient, she gets " a crazy feeling before the seizure and then collapses to the floor unconscious but without convulsive movements". Afterwards, she gets very sleepy. She was reported as being very sleepy earlier today and she expressed concerns about the possibility of impending seizures. CT brain requested. Complains of head discomfort but denies vertigo, double vision, difficulty swallowing, focal weakness or numbness, slurred speech, language or visual disturbances.   Past Medical History  Diagnosis Date  . Atrial fibrillation     Pt denies history of this, unclear why this is on patient's problem list  . Hypertension   . Diabetes mellitus without complication   . CAD (coronary artery disease)     History of 2 prior MI's, the most recent of which was in 2013  . Hyperlipemia   . GERD (gastroesophageal reflux disease)   . DVT (deep venous thrombosis)     History of 1 DVT around 2012, unclear if provoked or unprovoked. Was previously on Coumadin, but this was stopped due to a GI bleed   . Congestive heart failure     With ICD in place since 05/2010  . Epilepsy   . Obstructive sleep apnea     History reviewed. No pertinent past surgical history.  Family History  Problem Relation Age of Onset  . Heart attack Daughter 76  . Heart attack Sister 54  . Heart attack Mother 32  . Renal Disease Sister     ESRD, on HD  . Heart failure Daughter     Social History:  reports that she has been smoking.  She does not have any smokeless tobacco history on file. She reports that she does not drink alcohol or use illicit drugs.  Allergies  Allergen Reactions  . Hydrogen Peroxide Rash  . Iodine Rash  .  Nitroglycerin Other (See Comments)    Headache only  . Penicillins Anxiety    "makes me paranoid, like I'm on marijuana"  . Phenobarbital Rash    MEDICATIONS:                                                                                                                     I have reviewed the patient's current medications.   ROS:                                                                                                                                       History obtained from the patient  General ROS: negative for - chills, fatigue, fever, night sweats, weight gain or weight loss Psychological ROS: negative for - behavioral disorder, hallucinations, memory difficulties, mood swings or suicidal ideation Ophthalmic ROS: negative for - blurry vision, double vision, eye pain or loss of vision ENT ROS: negative for - epistaxis, nasal discharge, oral lesions, sore throat, tinnitus or vertigo Allergy and Immunology ROS: negative for - hives or itchy/watery eyes Hematological and Lymphatic ROS: negative for - bleeding problems, bruising or swollen lymph nodes Endocrine ROS: negative for - galactorrhea, hair pattern changes, polydipsia/polyuria or temperature intolerance Respiratory ROS: negative for - cough, hemoptysis Cardiovascular ROS: negative for - chest pain,  dyspnea on exertion, edema  Gastrointestinal ROS: negative for - abdominal pain, diarrhea, hematemesis, nausea/vomiting or stool incontinence Genito-Urinary ROS: negative for - dysuria, hematuria, incontinence or urinary frequency/urgency Musculoskeletal ROS: negative for - joint swelling or muscular weakness Neurological ROS: as noted in HPI Dermatological ROS: negative for rash and skin lesion changes     Physical exam: pleasant female in no apparent distress. Blood pressure 123/57, pulse 59, temperature 98 F (36.7 C), temperature source Oral, resp. rate 21, height 5\' 2"  (1.575 m), weight 128.5 kg (283 lb 4.7 oz), SpO2 94.00%. Head: normocephalic. Neck: supple, no bruits, no JVD. Cardiac: no murmurs. Lungs: clear. Abdomen: soft, no tender, no mass. Extremities: no edema.  Neurologic Examination:  Mental Status: Alert, oriented, thought content appropriate.  Speech fluent without evidence of aphasia.  Able to follow 3 step commands without difficulty. Cranial Nerves: II: Discs flat bilaterally; Visual fields grossly normal, pupils equal, round, reactive to light and accommodation III,IV, VI: ptosis not present, extra-ocular motions intact bilaterally V,VII: smile symmetric, facial light touch sensation normal bilaterally VIII: hearing normal bilaterally IX,X: gag reflex present XI: bilateral shoulder shrug XII: midline tongue extension Motor: Right : Upper extremity   5/5    Left:     Upper extremity   5/5  Lower extremity   5/5     Lower extremity   5/5 Tone and bulk:normal tone throughout; no atrophy noted Sensory: Pinprick and light touch intact throughout, bilaterally Deep Tendon Reflexes:  1+ all over  Plantars: Right: downgoing   Left: downgoing Cerebellar: normal finger-to-nose,  normal heel-to-shin test Gait: No tested. CV: pulses palpable throughout    Lab  Results  Component Value Date/Time   CHOL 239* 03/20/2013  5:10 AM    Results for orders placed during the hospital encounter of 03/19/13 (from the past 48 hour(s))  CBC WITH DIFFERENTIAL     Status: Abnormal   Collection Time    03/19/13  3:34 PM      Result Value Range   WBC 4.5  4.0 - 10.5 K/uL   RBC 4.25  3.87 - 5.11 MIL/uL   Hemoglobin 11.0 (*) 12.0 - 15.0 g/dL   HCT 16.1  09.6 - 04.5 %   MCV 84.7  78.0 - 100.0 fL   MCH 25.9 (*) 26.0 - 34.0 pg   MCHC 30.6  30.0 - 36.0 g/dL   RDW 40.9 (*) 81.1 - 91.4 %   Platelets 140 (*) 150 - 400 K/uL   Neutrophils Relative % 60  43 - 77 %   Neutro Abs 2.7  1.7 - 7.7 K/uL   Lymphocytes Relative 28  12 - 46 %   Lymphs Abs 1.3  0.7 - 4.0 K/uL   Monocytes Relative 10  3 - 12 %   Monocytes Absolute 0.4  0.1 - 1.0 K/uL   Eosinophils Relative 2  0 - 5 %   Eosinophils Absolute 0.1  0.0 - 0.7 K/uL   Basophils Relative 0  0 - 1 %   Basophils Absolute 0.0  0.0 - 0.1 K/uL  COMPREHENSIVE METABOLIC PANEL     Status: Abnormal   Collection Time    03/19/13  3:34 PM      Result Value Range   Sodium 139  135 - 145 mEq/L   Potassium 4.0  3.5 - 5.1 mEq/L   Chloride 106  96 - 112 mEq/L   CO2 23  19 - 32 mEq/L   Glucose, Bld 169 (*) 70 - 99 mg/dL   BUN 16  6 - 23 mg/dL   Creatinine, Ser 7.82  0.50 - 1.10 mg/dL   Calcium 9.0  8.4 - 95.6 mg/dL   Total Protein 6.6  6.0 - 8.3 g/dL   Albumin 3.2 (*) 3.5 - 5.2 g/dL   AST 16  0 - 37 U/L   ALT 7  0 - 35 U/L   Alkaline Phosphatase 52  39 - 117 U/L   Total Bilirubin 0.4  0.3 - 1.2 mg/dL   GFR calc non Af Amer 61 (*) >90 mL/min   GFR calc Af Amer 71 (*) >90 mL/min   Comment: (NOTE)     The eGFR has been calculated using the CKD  EPI equation.     This calculation has not been validated in all clinical situations.     eGFR's persistently <90 mL/min signify possible Chronic Kidney     Disease.  LIPASE, BLOOD     Status: None   Collection Time    03/19/13  3:34 PM      Result Value Range   Lipase 48  11 -  59 U/L  PRO B NATRIURETIC PEPTIDE     Status: Abnormal   Collection Time    03/19/13  3:35 PM      Result Value Range   Pro B Natriuretic peptide (BNP) 910.9 (*) 0 - 125 pg/mL  POCT I-STAT TROPONIN I     Status: None   Collection Time    03/19/13  3:39 PM      Result Value Range   Troponin i, poc 0.03  0.00 - 0.08 ng/mL   Comment 3            Comment: Due to the release kinetics of cTnI,     a negative result within the first hours     of the onset of symptoms does not rule out     myocardial infarction with certainty.     If myocardial infarction is still suspected,     repeat the test at appropriate intervals.  PROTIME-INR     Status: None   Collection Time    03/19/13  8:24 PM      Result Value Range   Prothrombin Time 12.8  11.6 - 15.2 seconds   INR 0.98  0.00 - 1.49  APTT     Status: Abnormal   Collection Time    03/19/13  8:24 PM      Result Value Range   aPTT 20 (*) 24 - 37 seconds  GLUCOSE, CAPILLARY     Status: None   Collection Time    03/19/13  9:12 PM      Result Value Range   Glucose-Capillary 88  70 - 99 mg/dL   Comment 1 Documented in Chart     Comment 2 Notify RN    TROPONIN I     Status: None   Collection Time    03/19/13 10:36 PM      Result Value Range   Troponin I <0.30  <0.30 ng/mL   Comment:            Due to the release kinetics of cTnI,     a negative result within the first hours     of the onset of symptoms does not rule out     myocardial infarction with certainty.     If myocardial infarction is still suspected,     repeat the test at appropriate intervals.  PROTIME-INR     Status: None   Collection Time    03/19/13 10:36 PM      Result Value Range   Prothrombin Time 12.9  11.6 - 15.2 seconds   INR 0.99  0.00 - 1.49  HEMOGLOBIN A1C     Status: Abnormal   Collection Time    03/19/13 10:36 PM      Result Value Range   Hemoglobin A1C 6.8 (*) <5.7 %   Comment: (NOTE)  According to the ADA Clinical Practice Recommendations for 2011, when     HbA1c is used as a screening test:      >=6.5%   Diagnostic of Diabetes Mellitus               (if abnormal result is confirmed)     5.7-6.4%   Increased risk of developing Diabetes Mellitus     References:Diagnosis and Classification of Diabetes Mellitus,Diabetes     Care,2011,34(Suppl 1):S62-S69 and Standards of Medical Care in             Diabetes - 2011,Diabetes Care,2011,34 (Suppl 1):S11-S61.   Mean Plasma Glucose 148 (*) <117 mg/dL   Comment: Performed at [facility]  MAGNESIUM     Status: None   Collection Time    03/19/13 10:36 PM      Result Value Range   Magnesium 1.9  1.5 - 2.5 mg/dL  HEPARIN LEVEL (UNFRACTIONATED)     Status: Abnormal   Collection Time    03/20/13  5:10 AM      Result Value Range   Heparin Unfractionated <0.10 (*) 0.30 - 0.70 IU/mL   Comment:            IF HEPARIN RESULTS ARE BELOW     EXPECTED VALUES, AND PATIENT     DOSAGE HAS BEEN CONFIRMED,     SUGGEST FOLLOW UP TESTING     OF ANTITHROMBIN III LEVELS.  CBC     Status: Abnormal   Collection Time    03/20/13  5:10 AM      Result Value Range   WBC 4.9  4.0 - 10.5 K/uL   RBC 4.28  3.87 - 5.11 MIL/uL   Hemoglobin 11.3 (*) 12.0 - 15.0 g/dL   HCT 16.1  09.6 - 04.5 %   MCV 86.2  78.0 - 100.0 fL   MCH 26.4  26.0 - 34.0 pg   MCHC 30.6  30.0 - 36.0 g/dL   RDW 40.9 (*) 81.1 - 91.4 %   Platelets 117 (*) 150 - 400 K/uL   Comment: PLATELET COUNT CONFIRMED BY SMEAR  TROPONIN I     Status: None   Collection Time    03/20/13  5:10 AM      Result Value Range   Troponin I <0.30  <0.30 ng/mL   Comment:            Due to the release kinetics of cTnI,     a negative result within the first hours     of the onset of symptoms does not rule out     myocardial infarction with certainty.     If myocardial infarction is still suspected,     repeat the test at appropriate intervals.  BASIC METABOLIC PANEL     Status:  Abnormal   Collection Time    03/20/13  5:10 AM      Result Value Range   Sodium 137  135 - 145 mEq/L   Potassium 4.5  3.5 - 5.1 mEq/L   Chloride 104  96 - 112 mEq/L   CO2 23  19 - 32 mEq/L   Glucose, Bld 111 (*) 70 - 99 mg/dL   BUN 16  6 - 23 mg/dL   Creatinine, Ser 7.82  0.50 - 1.10 mg/dL   Calcium 8.9  8.4 - 95.6 mg/dL   GFR calc non Af Amer 62 (*) >90 mL/min   GFR calc Af Amer 72 (*) >90 mL/min   Comment: (  NOTE)     The eGFR has been calculated using the CKD EPI equation.     This calculation has not been validated in all clinical situations.     eGFR's persistently <90 mL/min signify possible Chronic Kidney     Disease.  LIPID PANEL     Status: Abnormal   Collection Time    03/20/13  5:10 AM      Result Value Range   Cholesterol 239 (*) 0 - 200 mg/dL   Triglycerides 161  <096 mg/dL   HDL 63  >04 mg/dL   Total CHOL/HDL Ratio 3.8     VLDL 20  0 - 40 mg/dL   LDL Cholesterol 540 (*) 0 - 99 mg/dL   Comment:            Total Cholesterol/HDL:CHD Risk     Coronary Heart Disease Risk Table                         Men   Women      1/2 Average Risk   3.4   3.3      Average Risk       5.0   4.4      2 X Average Risk   9.6   7.1      3 X Average Risk  23.4   11.0                Use the calculated Patient Ratio     above and the CHD Risk Table     to determine the patient's CHD Risk.                ATP III CLASSIFICATION (LDL):      <100     mg/dL   Optimal      981-191  mg/dL   Near or Above                        Optimal      130-159  mg/dL   Borderline      478-295  mg/dL   High      >621     mg/dL   Very High  GLUCOSE, CAPILLARY     Status: Abnormal   Collection Time    03/20/13  6:28 AM      Result Value Range   Glucose-Capillary 119 (*) 70 - 99 mg/dL  URINALYSIS, ROUTINE W REFLEX MICROSCOPIC     Status: None   Collection Time    03/20/13 11:00 AM      Result Value Range   Color, Urine YELLOW  YELLOW   APPearance CLEAR  CLEAR   Specific Gravity, Urine 1.013   1.005 - 1.030   pH 5.5  5.0 - 8.0   Glucose, UA NEGATIVE  NEGATIVE mg/dL   Hgb urine dipstick NEGATIVE  NEGATIVE   Bilirubin Urine NEGATIVE  NEGATIVE   Ketones, ur NEGATIVE  NEGATIVE mg/dL   Protein, ur NEGATIVE  NEGATIVE mg/dL   Urobilinogen, UA 1.0  0.0 - 1.0 mg/dL   Nitrite NEGATIVE  NEGATIVE   Leukocytes, UA NEGATIVE  NEGATIVE   Comment: MICROSCOPIC NOT DONE ON URINES WITH NEGATIVE PROTEIN, BLOOD, LEUKOCYTES, NITRITE, OR GLUCOSE <1000 mg/dL.  GLUCOSE, CAPILLARY     Status: Abnormal   Collection Time    03/20/13 11:30 AM      Result Value Range   Glucose-Capillary 128 (*) 70 - 99 mg/dL  Comment 1 Notify RN    HEPARIN LEVEL (UNFRACTIONATED)     Status: None   Collection Time    03/20/13  2:57 PM      Result Value Range   Heparin Unfractionated 0.61  0.30 - 0.70 IU/mL   Comment:            IF HEPARIN RESULTS ARE BELOW     EXPECTED VALUES, AND PATIENT     DOSAGE HAS BEEN CONFIRMED,     SUGGEST FOLLOW UP TESTING     OF ANTITHROMBIN III LEVELS.  BLOOD GAS, ARTERIAL     Status: Abnormal   Collection Time    03/20/13  4:00 PM      Result Value Range   O2 Content 2.0     Delivery systems NASAL CANNULA     pH, Arterial 7.267 (*) 7.350 - 7.450   pCO2 arterial 62.4 (*) 35.0 - 45.0 mmHg   Comment: CRITICAL RESULT CALLED TO, READ BACK BY AND VERIFIED WITH:     LI NE MD AT 1750 BY EMILY TROGDON RRT,RCP ON 03/20/13   pO2, Arterial 76.6 (*) 80.0 - 100.0 mmHg   Bicarbonate 27.6 (*) 20.0 - 24.0 mEq/L   TCO2 29.5  0 - 100 mmol/L   Acid-Base Excess 1.3  0.0 - 2.0 mmol/L   O2 Saturation 96.1     Patient temperature 98.2     Collection site LEFT RADIAL     Drawn by [id number]     Sample type ARTERIAL     Allens test (pass/fail) PASS  PASS  URINE RAPID DRUG SCREEN (HOSP PERFORMED)     Status: None   Collection Time    03/20/13  4:28 PM      Result Value Range   Opiates NONE DETECTED  NONE DETECTED   Cocaine NONE DETECTED  NONE DETECTED   Benzodiazepines NONE DETECTED  NONE DETECTED    Amphetamines NONE DETECTED  NONE DETECTED   Tetrahydrocannabinol NONE DETECTED  NONE DETECTED   Barbiturates NONE DETECTED  NONE DETECTED   Comment:            DRUG SCREEN FOR MEDICAL PURPOSES     ONLY.  IF CONFIRMATION IS NEEDED     FOR ANY PURPOSE, NOTIFY LAB     WITHIN 5 DAYS.                LOWEST DETECTABLE LIMITS     FOR URINE DRUG SCREEN     Drug Class       Cutoff (ng/mL)     Amphetamine      1000     Barbiturate      200     Benzodiazepine   200     Tricyclics       300     Opiates          300     Cocaine          300     THC              50  GLUCOSE, CAPILLARY     Status: Abnormal   Collection Time    03/20/13  5:02 PM      Result Value Range   Glucose-Capillary 105 (*) 70 - 99 mg/dL   Comment 1 Notify RN    BASIC METABOLIC PANEL     Status: Abnormal   Collection Time    03/20/13  6:18 PM      Result Value Range  Sodium 137  135 - 145 mEq/L   Potassium 3.9  3.5 - 5.1 mEq/L   Chloride 101  96 - 112 mEq/L   CO2 30  19 - 32 mEq/L   Glucose, Bld 106 (*) 70 - 99 mg/dL   BUN 18  6 - 23 mg/dL   Creatinine, Ser 6.29  0.50 - 1.10 mg/dL   Calcium 9.0  8.4 - 52.8 mg/dL   GFR calc non Af Amer 52 (*) >90 mL/min   GFR calc Af Amer 61 (*) >90 mL/min   Comment: (NOTE)     The eGFR has been calculated using the CKD EPI equation.     This calculation has not been validated in all clinical situations.     eGFR's persistently <90 mL/min signify possible Chronic Kidney     Disease.  MAGNESIUM     Status: None   Collection Time    03/20/13  6:18 PM      Result Value Range   Magnesium 1.8  1.5 - 2.5 mg/dL  MRSA PCR SCREENING     Status: None   Collection Time    03/20/13  6:52 PM      Result Value Range   MRSA by PCR NEGATIVE  NEGATIVE   Comment:            The GeneXpert MRSA Assay (FDA     approved for NASAL specimens     only), is one component of a     comprehensive MRSA colonization     surveillance program. It is not     intended to diagnose MRSA     infection  nor to guide or     monitor treatment for     MRSA infections.  CBC WITH DIFFERENTIAL     Status: Abnormal   Collection Time    03/20/13  8:24 PM      Result Value Range   WBC 4.6  4.0 - 10.5 K/uL   RBC 4.05  3.87 - 5.11 MIL/uL   Hemoglobin 10.7 (*) 12.0 - 15.0 g/dL   HCT 41.3 (*) 24.4 - 01.0 %   MCV 87.2  78.0 - 100.0 fL   MCH 26.4  26.0 - 34.0 pg   MCHC 30.3  30.0 - 36.0 g/dL   RDW 27.2 (*) 53.6 - 64.4 %   Platelets 163  150 - 400 K/uL   Comment: DELTA CHECK NOTED     REPEATED TO VERIFY   Neutrophils Relative % 37 (*) 43 - 77 %   Neutro Abs 1.7  1.7 - 7.7 K/uL   Lymphocytes Relative 44  12 - 46 %   Lymphs Abs 2.0  0.7 - 4.0 K/uL   Monocytes Relative 16 (*) 3 - 12 %   Monocytes Absolute 0.7  0.1 - 1.0 K/uL   Eosinophils Relative 3  0 - 5 %   Eosinophils Absolute 0.1  0.0 - 0.7 K/uL   Basophils Relative 0  0 - 1 %   Basophils Absolute 0.0  0.0 - 0.1 K/uL  COMPREHENSIVE METABOLIC PANEL     Status: Abnormal   Collection Time    03/20/13  8:24 PM      Result Value Range   Sodium 137  135 - 145 mEq/L   Potassium 3.8  3.5 - 5.1 mEq/L   Chloride 101  96 - 112 mEq/L   CO2 29  19 - 32 mEq/L   Glucose, Bld 107 (*) 70 - 99 mg/dL  BUN 19  6 - 23 mg/dL   Creatinine, Ser 1.61  0.50 - 1.10 mg/dL   Calcium 9.0  8.4 - 09.6 mg/dL   Total Protein 6.4  6.0 - 8.3 g/dL   Albumin 3.2 (*) 3.5 - 5.2 g/dL   AST 12  0 - 37 U/L   ALT 8  0 - 35 U/L   Alkaline Phosphatase 53  39 - 117 U/L   Total Bilirubin 0.3  0.3 - 1.2 mg/dL   GFR calc non Af Amer 53 (*) >90 mL/min   GFR calc Af Amer 61 (*) >90 mL/min   Comment: (NOTE)     The eGFR has been calculated using the CKD EPI equation.     This calculation has not been validated in all clinical situations.     eGFR's persistently <90 mL/min signify possible Chronic Kidney     Disease.    Dg Chest 2 View  03/19/2013   *RADIOLOGY REPORT*  Clinical Data: Chest pain  CHEST - 2 VIEW  Comparison: None.  Findings: Cardiac shadow is enlarged.  A  defibrillator is noted. The lungs are clear.  No acute bony abnormality is noted.  IMPRESSION: No acute abnormalities seen.   Original Report Authenticated By: Alcide Clever, M.D.     Assessment/Plan: 67 y/o with multiple medical problems including a long standing history of seizures with a semiology described above. I am not quite convinced she had seizures today. On keppra 1,000 mg BID. She said that she had had extensive seizure work up in the past and except for the already requested CT brain  I will not recommend pursuing further neurological testing while in the hospital, unless there is a frank change in her neurological status.. Continue keppra. Obtain outside neurological records. Will follow up  Wyatt Portela, MD [facility] [phone]  03/20/2013, 9:29 PM

## 2013-03-20 NOTE — Progress Notes (Signed)
Medication assistance is not available due to pt having Medicare insurance.  NCM notified pt.

## 2013-03-20 NOTE — Consult Note (Signed)
[facility] Face-to-Face Psychiatry Consult   Reason for Consult:  Depression and suicidal thinking Referring Physician:  Dr. Reeves Forth is an 66 y.o. female.  Assessment: AXIS I:  Major Depression, Recurrent severe AXIS II:  Deferred AXIS III:   Past Medical History  Diagnosis Date  . Atrial fibrillation     Pt denies history of this, unclear why this is on patient's problem list  . Hypertension   . Diabetes mellitus without complication   . CAD (coronary artery disease)     History of 2 prior MI's, the most recent of which was in 2013  . Hyperlipemia   . GERD (gastroesophageal reflux disease)   . DVT (deep venous thrombosis)     History of 1 DVT around 2012, unclear if provoked or unprovoked. Was previously on Coumadin, but this was stopped due to a GI bleed  . Congestive heart failure     With ICD in place since 05/2010  . Epilepsy   . Obstructive sleep apnea    AXIS IV:  other psychosocial or environmental problems and problems related to social environment AXIS V:  51-60 moderate symptoms  Plan:  Start Abilify 2 milligrams daily if not medically contraindicated.  Continue sitter for safety precaution.  We will followup and monitor the progress  Subjective:   Kelly Phelps is a 67 y.o. female patient admitted with chest pain.  HPI:  Patient seen and chart reviewed.  Patient is 67 year old history of hypertension, CHF , coronary artery disease, morbid obesity and other multiple health issues admitted for chest pain.  Patient is a poor historian and did not provide much information.  Consult was called as patient expressed suicidal thoughts.  Patient has a long history of psychiatric illness with multiple hospitalizations due to overdose and self abusive behavior.  She recently relocated to [facility] to live close to her niece.  Patient endorsed noncompliant with her psychiatric medication for at least 1-1/2 year period she believed him up for psychotropic medication work however  she did not provide the details of the psychiatric medication.  Currently she is taking Cymbalta however she does not feel it is working.  She admitted chronic feelings of depression with crying spells, lack of sleep and very stressed about her family.  Apparently most of her family members are dead or killed.  She has 5 children however no children is living anymore.  Patient became very emotional and tearful about the topic of her children and family situation and did not provide much detail.  She admitted having hallucinations and feelings of hopelessness and helplessness.  Despite taking Cymbalta she does not feel any improvement.  She did remember taking Restoril trazodone in the past but she stopped because she felt it did not help.  Patient endorsed some time having paranoia and hallucination.  She feels some time hallucinations are very intense.  She admitted some time she had voices telling her to do bad things however she is able to control herself because doing the wrong thing is not worth it.  Patient admitted passive suicidal thinking but denies any suicidal plan.  She wants to try Abilify which she has never tried before.  Past psychiatric history.  HPI Elements:   Location:  Medical floor. Quality:  Unable to function. Severity:  Moderate.  Past Psychiatric History: Past Medical History  Diagnosis Date  . Atrial fibrillation     Pt denies history of this, unclear why this is on patient's problem list  . Hypertension   .  Diabetes mellitus without complication   . CAD (coronary artery disease)     History of 2 prior MI's, the most recent of which was in 2013  . Hyperlipemia   . GERD (gastroesophageal reflux disease)   . DVT (deep venous thrombosis)     History of 1 DVT around 2012, unclear if provoked or unprovoked. Was previously on Coumadin, but this was stopped due to a GI bleed  . Congestive heart failure     With ICD in place since 05/2010  . Epilepsy   . Obstructive sleep  apnea     reports that she has been smoking.  She does not have any smokeless tobacco history on file. She reports that she does not drink alcohol or use illicit drugs. Family History  Problem Relation Age of Onset  . Heart attack Daughter 2  . Heart attack Sister 50  . Heart attack Mother 42  . Renal Disease Sister     ESRD, on HD  . Heart failure Daughter            Allergies:   Allergies  Allergen Reactions  . Hydrogen Peroxide Rash  . Iodine Rash  . Nitroglycerin Other (See Comments)    Headache only  . Penicillins Anxiety    "makes me paranoid, like I'm on marijuana"  . Phenobarbital Rash    ACT Assessment Complete:  No:   Past Psychiatric History: Diagnosis:  Depression   Hospitalizations:  Yes at a hospital in [place]   Outpatient Care:  Not at this time   Substance Abuse Care:  She has history of cocaine and alcohol use   Self-Mutilation:  Yes cutting her wrist   Suicidal Attempts:  She is taking overdose on medication   Homicidal Behaviors:  Unknown    Violent Behaviors:  Unknown    Place of Residence:  Lives with her niece Marital Status:  Single Employed/Unemployed:  Disability Education:  Unknown Family Supports:  Lives with her niece Objective: Blood pressure 108/49, pulse 69, temperature 97.6 F (36.4 C), temperature source Oral, resp. rate 18, height 5\' 2"  (1.575 m), weight 283 lb 4.7 oz (128.5 kg), SpO2 97.00%.Body mass index is 51.8 kg/(m^2). Results for orders placed during the hospital encounter of 03/19/13 (from the past 72 hour(s))  CBC WITH DIFFERENTIAL     Status: Abnormal   Collection Time    03/19/13  3:34 PM      Result Value Range   WBC 4.5  4.0 - 10.5 K/uL   RBC 4.25  3.87 - 5.11 MIL/uL   Hemoglobin 11.0 (*) 12.0 - 15.0 g/dL   HCT 56.2  13.0 - 86.5 %   MCV 84.7  78.0 - 100.0 fL   MCH 25.9 (*) 26.0 - 34.0 pg   MCHC 30.6  30.0 - 36.0 g/dL   RDW 78.4 (*) 69.6 - 29.5 %   Platelets 140 (*) 150 - 400 K/uL   Neutrophils Relative % 60   43 - 77 %   Neutro Abs 2.7  1.7 - 7.7 K/uL   Lymphocytes Relative 28  12 - 46 %   Lymphs Abs 1.3  0.7 - 4.0 K/uL   Monocytes Relative 10  3 - 12 %   Monocytes Absolute 0.4  0.1 - 1.0 K/uL   Eosinophils Relative 2  0 - 5 %   Eosinophils Absolute 0.1  0.0 - 0.7 K/uL   Basophils Relative 0  0 - 1 %   Basophils Absolute 0.0  0.0 -  0.1 K/uL  COMPREHENSIVE METABOLIC PANEL     Status: Abnormal   Collection Time    03/19/13  3:34 PM      Result Value Range   Sodium 139  135 - 145 mEq/L   Potassium 4.0  3.5 - 5.1 mEq/L   Chloride 106  96 - 112 mEq/L   CO2 23  19 - 32 mEq/L   Glucose, Bld 169 (*) 70 - 99 mg/dL   BUN 16  6 - 23 mg/dL   Creatinine, Ser 7.82  0.50 - 1.10 mg/dL   Calcium 9.0  8.4 - 95.6 mg/dL   Total Protein 6.6  6.0 - 8.3 g/dL   Albumin 3.2 (*) 3.5 - 5.2 g/dL   AST 16  0 - 37 U/L   ALT 7  0 - 35 U/L   Alkaline Phosphatase 52  39 - 117 U/L   Total Bilirubin 0.4  0.3 - 1.2 mg/dL   GFR calc non Af Amer 61 (*) >90 mL/min   GFR calc Af Amer 71 (*) >90 mL/min   Comment: (NOTE)     The eGFR has been calculated using the CKD EPI equation.     This calculation has not been validated in all clinical situations.     eGFR's persistently <90 mL/min signify possible Chronic Kidney     Disease.  LIPASE, BLOOD     Status: None   Collection Time    03/19/13  3:34 PM      Result Value Range   Lipase 48  11 - 59 U/L  PRO B NATRIURETIC PEPTIDE     Status: Abnormal   Collection Time    03/19/13  3:35 PM      Result Value Range   Pro B Natriuretic peptide (BNP) 910.9 (*) 0 - 125 pg/mL  POCT I-STAT TROPONIN I     Status: None   Collection Time    03/19/13  3:39 PM      Result Value Range   Troponin i, poc 0.03  0.00 - 0.08 ng/mL   Comment 3            Comment: Due to the release kinetics of cTnI,     a negative result within the first hours     of the onset of symptoms does not rule out     myocardial infarction with certainty.     If myocardial infarction is still suspected,      repeat the test at appropriate intervals.  PROTIME-INR     Status: None   Collection Time    03/19/13  8:24 PM      Result Value Range   Prothrombin Time 12.8  11.6 - 15.2 seconds   INR 0.98  0.00 - 1.49  APTT     Status: Abnormal   Collection Time    03/19/13  8:24 PM      Result Value Range   aPTT 20 (*) 24 - 37 seconds  GLUCOSE, CAPILLARY     Status: None   Collection Time    03/19/13  9:12 PM      Result Value Range   Glucose-Capillary 88  70 - 99 mg/dL   Comment 1 Documented in Chart     Comment 2 Notify RN    TROPONIN I     Status: None   Collection Time    03/19/13 10:36 PM      Result Value Range   Troponin I <0.30  <0.30 ng/mL  Comment:            Due to the release kinetics of cTnI,     a negative result within the first hours     of the onset of symptoms does not rule out     myocardial infarction with certainty.     If myocardial infarction is still suspected,     repeat the test at appropriate intervals.  PROTIME-INR     Status: None   Collection Time    03/19/13 10:36 PM      Result Value Range   Prothrombin Time 12.9  11.6 - 15.2 seconds   INR 0.99  0.00 - 1.49  HEMOGLOBIN A1C     Status: Abnormal   Collection Time    03/19/13 10:36 PM      Result Value Range   Hemoglobin A1C 6.8 (*) <5.7 %   Comment: (NOTE)                                                                               According to the ADA Clinical Practice Recommendations for 2011, when     HbA1c is used as a screening test:      >=6.5%   Diagnostic of Diabetes Mellitus               (if abnormal result is confirmed)     5.7-6.4%   Increased risk of developing Diabetes Mellitus     References:Diagnosis and Classification of Diabetes Mellitus,Diabetes     Care,2011,34(Suppl 1):S62-S69 and Standards of Medical Care in             Diabetes - 2011,Diabetes Care,2011,34 (Suppl 1):S11-S61.   Mean Plasma Glucose 148 (*) <117 mg/dL   Comment: Performed at [facility]  MAGNESIUM      Status: None   Collection Time    03/19/13 10:36 PM      Result Value Range   Magnesium 1.9  1.5 - 2.5 mg/dL  HEPARIN LEVEL (UNFRACTIONATED)     Status: Abnormal   Collection Time    03/20/13  5:10 AM      Result Value Range   Heparin Unfractionated <0.10 (*) 0.30 - 0.70 IU/mL   Comment:            IF HEPARIN RESULTS ARE BELOW     EXPECTED VALUES, AND PATIENT     DOSAGE HAS BEEN CONFIRMED,     SUGGEST FOLLOW UP TESTING     OF ANTITHROMBIN III LEVELS.  CBC     Status: Abnormal   Collection Time    03/20/13  5:10 AM      Result Value Range   WBC 4.9  4.0 - 10.5 K/uL   RBC 4.28  3.87 - 5.11 MIL/uL   Hemoglobin 11.3 (*) 12.0 - 15.0 g/dL   HCT 16.1  09.6 - 04.5 %   MCV 86.2  78.0 - 100.0 fL   MCH 26.4  26.0 - 34.0 pg   MCHC 30.6  30.0 - 36.0 g/dL   RDW 40.9 (*) 81.1 - 91.4 %   Platelets 117 (*) 150 - 400 K/uL   Comment: PLATELET COUNT CONFIRMED BY SMEAR  TROPONIN I  Status: None   Collection Time    03/20/13  5:10 AM      Result Value Range   Troponin I <0.30  <0.30 ng/mL   Comment:            Due to the release kinetics of cTnI,     a negative result within the first hours     of the onset of symptoms does not rule out     myocardial infarction with certainty.     If myocardial infarction is still suspected,     repeat the test at appropriate intervals.  BASIC METABOLIC PANEL     Status: Abnormal   Collection Time    03/20/13  5:10 AM      Result Value Range   Sodium 137  135 - 145 mEq/L   Potassium 4.5  3.5 - 5.1 mEq/L   Chloride 104  96 - 112 mEq/L   CO2 23  19 - 32 mEq/L   Glucose, Bld 111 (*) 70 - 99 mg/dL   BUN 16  6 - 23 mg/dL   Creatinine, Ser 9.81  0.50 - 1.10 mg/dL   Calcium 8.9  8.4 - 19.1 mg/dL   GFR calc non Af Amer 62 (*) >90 mL/min   GFR calc Af Amer 72 (*) >90 mL/min   Comment: (NOTE)     The eGFR has been calculated using the CKD EPI equation.     This calculation has not been validated in all clinical situations.     eGFR's persistently <90  mL/min signify possible Chronic Kidney     Disease.  LIPID PANEL     Status: Abnormal   Collection Time    03/20/13  5:10 AM      Result Value Range   Cholesterol 239 (*) 0 - 200 mg/dL   Triglycerides 478  <295 mg/dL   HDL 63  >62 mg/dL   Total CHOL/HDL Ratio 3.8     VLDL 20  0 - 40 mg/dL   LDL Cholesterol 130 (*) 0 - 99 mg/dL   Comment:            Total Cholesterol/HDL:CHD Risk     Coronary Heart Disease Risk Table                         Men   Women      1/2 Average Risk   3.4   3.3      Average Risk       5.0   4.4      2 X Average Risk   9.6   7.1      3 X Average Risk  23.4   11.0                Use the calculated Patient Ratio     above and the CHD Risk Table     to determine the patient's CHD Risk.                ATP III CLASSIFICATION (LDL):      <100     mg/dL   Optimal      865-784  mg/dL   Near or Above                        Optimal      130-159  mg/dL   Borderline      696-295  mg/dL   High      >  190     mg/dL   Very High  GLUCOSE, CAPILLARY     Status: Abnormal   Collection Time    03/20/13  6:28 AM      Result Value Range   Glucose-Capillary 119 (*) 70 - 99 mg/dL  URINALYSIS, ROUTINE W REFLEX MICROSCOPIC     Status: None   Collection Time    03/20/13 11:00 AM      Result Value Range   Color, Urine YELLOW  YELLOW   APPearance CLEAR  CLEAR   Specific Gravity, Urine 1.013  1.005 - 1.030   pH 5.5  5.0 - 8.0   Glucose, UA NEGATIVE  NEGATIVE mg/dL   Hgb urine dipstick NEGATIVE  NEGATIVE   Bilirubin Urine NEGATIVE  NEGATIVE   Ketones, ur NEGATIVE  NEGATIVE mg/dL   Protein, ur NEGATIVE  NEGATIVE mg/dL   Urobilinogen, UA 1.0  0.0 - 1.0 mg/dL   Nitrite NEGATIVE  NEGATIVE   Leukocytes, UA NEGATIVE  NEGATIVE   Comment: MICROSCOPIC NOT DONE ON URINES WITH NEGATIVE PROTEIN, BLOOD, LEUKOCYTES, NITRITE, OR GLUCOSE <1000 mg/dL.  GLUCOSE, CAPILLARY     Status: Abnormal   Collection Time    03/20/13 11:30 AM      Result Value Range   Glucose-Capillary 128 (*) 70  - 99 mg/dL   Comment 1 Notify RN     Labs are reviewed and are pertinent for metabolic abnormalities.  Current Facility-Administered Medications  Medication Dose Route Frequency Provider Last Rate Last Dose  . 0.9 %  sodium chloride infusion   Intravenous Continuous Toy Baker, MD 10 mL/hr at 03/19/13 2016 10 mL/hr at 03/19/13 2016  . acetaminophen (TYLENOL) tablet 650 mg  650 mg Oral Q4H PRN Linward Headland, MD      . acetaZOLAMIDE (DIAMOX) tablet 250 mg  250 mg Oral TID Linward Headland, MD   250 mg at 03/20/13 0942  . aspirin EC tablet 81 mg  81 mg Oral Daily Linward Headland, MD   81 mg at 03/20/13 0942  . atorvastatin (LIPITOR) tablet 80 mg  80 mg Oral q1800 Linward Headland, MD      . carvedilol (COREG) tablet 25 mg  25 mg Oral BID WC Linward Headland, MD   25 mg at 03/20/13 8119  . furosemide (LASIX) injection 40 mg  40 mg Intravenous BID Linward Headland, MD   40 mg at 03/20/13 0908  . heparin ADULT infusion 100 units/mL (25000 units/250 mL)  1,500 Units/hr Intravenous Continuous Colleen Can, [facility] 15 mL/hr at 03/20/13 1478 1,500 Units/hr at 03/20/13 2956  . insulin aspart (novoLOG) injection 0-15 Units  0-15 Units Subcutaneous TID WC Linward Headland, MD   2 Units at 03/20/13 1238  . insulin aspart (novoLOG) injection 0-5 Units  0-5 Units Subcutaneous QHS Linward Headland, MD      . insulin glargine (LANTUS) injection 25 Units  25 Units Subcutaneous QHS Na Li, MD      . levETIRAcetam (KEPPRA) tablet 1,000 mg  1,000 mg Oral BID Linward Headland, MD   1,000 mg at 03/20/13 0941  . nicotine (NICODERM CQ - dosed in mg/24 hours) patch 14 mg  14 mg Transdermal Daily PRN Linward Headland, MD      . ondansetron [facility]) injection 4 mg  4 mg Intravenous Q6H PRN Linward Headland, MD      . pantoprazole (PROTONIX) EC tablet 40 mg  40 mg Oral Daily Linward Headland,  MD   40 mg at 03/20/13 0946    Psychiatric Specialty Exam:     Blood pressure 108/49, pulse 69, temperature 97.6 F (36.4 C), temperature source Oral, resp. rate  18, height 5\' 2"  (1.575 m), weight 283 lb 4.7 oz (128.5 kg), SpO2 97.00%.Body mass index is 51.8 kg/(m^2).  General Appearance: Fairly Groomed  Patent attorney::  Fair  Speech:  Pressured  Volume:  Decreased  Mood:  Depressed, Hopeless and Irritable  Affect:  Constricted, Depressed and Restricted  Thought Process:  Circumstantial and Disorganized  Orientation:  Full (Time, Place, and Person)  Thought Content:  Hallucinations: Auditory and Rumination  Suicidal Thoughts:  Yes.  without intent/plan  Homicidal Thoughts:  No  Memory:  NA  Judgement:  Intact  Insight:  Fair  Psychomotor Activity:  Increased  Concentration:  Fair  Recall:  Fair  Akathisia:  No  Handed:  Right  AIMS (if indicated):     Assets:  Housing  Sleep:      Treatment Plan Summary: Start Abilify 2 mg daily if not medically contraindicated, continue sitter for patient safety.  Consultation liaison services will follow up and monitor the progress.   Sohrab Keelan T. 03/20/2013 3:12 PM

## 2013-03-20 NOTE — Progress Notes (Addendum)
1530 Pt continously  Sleepy . arousable  coherent  But easily to go back to sleep . VS done nad charted . Referred to Dr. Sondra Barges with orders

## 2013-03-20 NOTE — Progress Notes (Addendum)
1615 Seen by Dr. [name] and her associate. Evaluate pt  Agreed with Dr , Rabanni"s order . Left with order. Foley cath #16 inserted aseptically .Tolerated procedure well . UDS obtained  And sent to lab  Abg  Attempted by respiratory therapist but unsuccessful .Another therapist will attempt .  Dr Juel Burrow at bedside .  1700 Pt to transfer to step down unit the patient aware . No  Telephone for pt's niece  Pt can not recall number

## 2013-03-20 NOTE — Progress Notes (Signed)
Subjective:  Sleepy. She states she feels drugged up. No further CP. Has sitter at beside, suicidal prior.   Objective:  Vital Signs in the last 24 hours: Temp:  [97.4 F (36.3 C)-97.9 F (36.6 C)] 97.6 F (36.4 C) (08/29 0541) Pulse Rate:  [55-85] 70 (08/29 0541) Resp:  [14-24] 18 (08/29 0541) BP: (95-129)/(40-61) 110/54 mmHg (08/29 0938) SpO2:  [92 %-100 %] 97 % (08/29 0938) Weight:  [127.9 kg (281 lb 15.5 oz)-128.5 kg (283 lb 4.7 oz)] 128.5 kg (283 lb 4.7 oz) (08/29 0541)  Intake/Output from previous day: 08/28 0701 - 08/29 0700 In: 349.3 [P.O.:240; I.V.:109.3] Out: 400 [Urine:400]   Physical Exam: General: Sleeping but arousable (appears to have OSA), in no acute distress. Head:  Normocephalic and atraumatic. Lungs: Clear to auscultation and percussion. Heart: Normal S1 and S2.  No murmur, rubs or gallops.  Abdomen: soft, non-tender, positive bowel sounds. Obese Extremities: No clubbing or cyanosis. Trace edema. Neurologic: Alert and oriented x 3 after awakened.     Lab Results:  Recent Labs  03/19/13 1534 03/20/13 0510  WBC 4.5 4.9  HGB 11.0* 11.3*  PLT 140* 117*    Recent Labs  03/19/13 1534 03/20/13 0510  NA 139 137  K 4.0 4.5  CL 106 104  CO2 23 23  GLUCOSE 169* 111*  BUN 16 16  CREATININE 0.93 0.92    Recent Labs  03/19/13 2236 03/20/13 0510  TROPONINI <0.30 <0.30   Hepatic Function Panel  Recent Labs  03/19/13 1534  PROT 6.6  ALBUMIN 3.2*  AST 16  ALT 7  ALKPHOS 52  BILITOT 0.4    Recent Labs  03/20/13 0510  CHOL 239*   No results found for this basename: PROTIME,  in the last 72 hours  Imaging: Dg Chest 2 View  03/19/2013   *RADIOLOGY REPORT*  Clinical Data: Chest pain  CHEST - 2 VIEW  Comparison: None.  Findings: Cardiac shadow is enlarged.  A defibrillator is noted. The lungs are clear.  No acute bony abnormality is noted.  IMPRESSION: No acute abnormalities seen.   Original Report Authenticated By: Alcide Clever, M.D.    Personally viewed.   Telemetry: SR/ [place] Personally viewed.   EKG:  No new ECG  Assessment/Plan:  Principal Problem:   Anginal chest pain at rest Active Problems:   Hypertension   Diabetes mellitus without complication   Tobacco abuse   Congestive heart failure   1) Chest pain  - resolved  - Troponin normal, reassuring  - ? MSK, psych related.  - No further cardiac testing at this time.   - Would review prior records from [place] when they arrive  - She remembers prior cath (no prior PCI she states) which makes be believe that she has non ischemic cardiomyopathy. ICD in place.  - ? history however.   - Recommend stopping heparin IV.   2) DM  - primary team  3) Abnormal ECG - see consult note  4) Suicidal ideations - sitter  5) ICD   6) Cardiomyopathy  - Coreg  - ARB or ACE-I when able  - Lasix 40 IV BID (change to PO likely tomorrow) Anne Fu, Song Garris 03/20/2013, 1:56 PM

## 2013-03-20 NOTE — Progress Notes (Addendum)
ANTICOAGULATION CONSULT NOTE - Follow Up Consult  Pharmacy Consult for heparin Indication: chest pain/ACS  Labs:  Recent Labs  03/19/13 1534 03/19/13 2024 03/19/13 2236 03/20/13 0510  HGB 11.0*  --   --  11.3*  HCT 36.0  --   --  36.9  PLT 140*  --   --  117*  APTT  --  20*  --   --   LABPROT  --  12.8 12.9  --   INR  --  0.98 0.99  --   HEPARINUNFRC  --   --   --  <0.10*  CREATININE 0.93  --   --  0.92  TROPONINI  --   --  <0.30 <0.30    Assessment: 67yo female admitted 03/19/2013  With CP.  Pharmacy consulted to dose heparin.  PMH: HF ( EF ?   , ICD), HTN, DM, obesity, Hx of DVT off anticoag d/t GIB, OSA, epilepsy, (possible afib)  Coag: ACS, heparin @1500 , undetectable on heparin with initial dosing for CP, HL planned for 1300  Cards: HF, HTN: asa, atorv 80, coreg, fuor 40 iv bid, acetazolamide: VSS, LDL 156, troponins remain negative  Endo: DM, SSI, glucose < 200  Nero: epilepsy: Keppra 1g BID (PTA dose)  PTA Med Issues: Home medications not ordered:  Acetazolamide, cymbalta, hydralazine, losartan 25 daily, linagliptin 5 daily, Lantus  Goal of Therapy:  Heparin level 0.3-0.7 units/ml   Plan:  1. Continue 1500 units/hr, follow up heparin level at 1300, adjust as indicated.  Addendum: Follow up heparin level is therapeutic at 0.61. Will continue heparin at 1500 units/hr for now and check a 6 hour heparin level to confirm.  Louie Casa, PharmD, BCPS 03/20/13, 3:23PM

## 2013-03-20 NOTE — Progress Notes (Signed)
1815 Telephone report given to [facility] RN [phone]

## 2013-03-20 NOTE — Progress Notes (Signed)
Utilization review completed. Tonnya Garbett, RN, BSN. 

## 2013-03-20 NOTE — Progress Notes (Signed)
Subjective:  Pt seen and examined. Patient not very alert and hard to understand speech. Very drowsy and sleeping in chair.  Reports chest pain better and improved dyspnea.       Objective: Vital signs in last 24 hours: Filed Vitals:   03/20/13 0541 03/20/13 0938 03/20/13 1457 03/20/13 1531  BP: 107/57 110/54 108/49   Pulse: 70  69   Temp: 97.6 F (36.4 C)   98.2 F (36.8 C)  TempSrc: Oral   Oral  Resp: 18     Height:      Weight: 128.5 kg (283 lb 4.7 oz)     SpO2: 92% 97% 97%    Weight change:   Intake/Output Summary (Last 24 hours) at 03/20/13 1627 Last data filed at 03/20/13 1230  Gross per 24 hour  Intake 669.33 ml  Output   1600 ml  Net -930.67 ml    Lab Results: Basic Metabolic Panel:  Constitutional: Snoring, very drowsy but arousable   Head: Normocephalic and atraumatic.  Eyes: Conjunctivae, EOM and lids are normal.  Neck: Normal range of motion. Neck supple. No JVD  Cardiovascular: Normal rate, regular rhythm and normal heart sounds. Exam reveals no gallop.  No murmur heard.  Pulmonary/Chest: Effort normal. No stridor. No respiratory distress. She has decreased air movement She has no wheezes. She has no rhonchi. She has no rales.  Abdominal: Soft. Normal appearance and bowel sounds are normal. She exhibits no distension. Epigastric tenderness. There is no rebound.  Musculoskeletal: Normal range of motion. She exhibits +1/2 non pitting edema in lower extremities bilaterally  Neurological: She is alert and oriented to person, place, and time. She has normal strength. No cranial nerve deficit or sensory deficit.  Skin: Skin is warm and dry. No abrasion and no rash noted.  Psychiatric: Her speech is not clear, difficulty understanding speech      Recent Labs Lab 03/19/13 1534 03/19/13 2236 03/20/13 0510  NA 139  --  137  K 4.0  --  4.5  CL 106  --  104  CO2 23  --  23  GLUCOSE 169*  --  111*  BUN 16  --  16  CREATININE 0.93  --  0.92  CALCIUM 9.0   --  8.9  MG  --  1.9  --    Liver Function Tests:  Recent Labs Lab 03/19/13 1534  AST 16  ALT 7  ALKPHOS 52  BILITOT 0.4  PROT 6.6  ALBUMIN 3.2*    Recent Labs Lab 03/19/13 1534  LIPASE 48   No results found for this basename: AMMONIA,  in the last 168 hours CBC:  Recent Labs Lab 03/19/13 1534 03/20/13 0510  WBC 4.5 4.9  NEUTROABS 2.7  --   HGB 11.0* 11.3*  HCT 36.0 36.9  MCV 84.7 86.2  PLT 140* 117*   Cardiac Enzymes:  Recent Labs Lab 03/19/13 2236 03/20/13 0510  TROPONINI <0.30 <0.30   BNP:  Recent Labs Lab 03/19/13 1535  PROBNP 910.9*   D-Dimer: No results found for this basename: DDIMER,  in the last 168 hours CBG:  Recent Labs Lab 03/19/13 2112 03/20/13 0628 03/20/13 1130  GLUCAP 88 119* 128*   Hemoglobin A1C:  Recent Labs Lab 03/19/13 2236  HGBA1C 6.8*   Fasting Lipid Panel:  Recent Labs Lab 03/20/13 0510  CHOL 239*  HDL 63  LDLCALC 156*  TRIG 101  CHOLHDL 3.8   Thyroid Function Tests: No results found for this basename: TSH, T4TOTAL,  [place], THYROIDAB,  in the last 168 hours Coagulation:  Recent Labs Lab 03/19/13 2024 03/19/13 2236  LABPROT 12.8 12.9  INR 0.98 0.99   Anemia Panel: No results found for this basename: VITAMINB12, FOLATE, FERRITIN, TIBC, IRON, RETICCTPCT,  in the last 168 hours Urine Drug Screen: Drugs of Abuse  No results found for this basename: labopia, cocainscrnur, labbenz, amphetmu, thcu, labbarb    Alcohol Level: No results found for this basename: ETH,  in the last 168 hours Urinalysis:  Recent Labs Lab 03/20/13 1100  COLORURINE YELLOW  LABSPEC 1.013  PHURINE 5.5  GLUCOSEU NEGATIVE  HGBUR NEGATIVE  BILIRUBINUR NEGATIVE  KETONESUR NEGATIVE  PROTEINUR NEGATIVE  UROBILINOGEN 1.0  NITRITE NEGATIVE  LEUKOCYTESUR NEGATIVE   Misc. Labs:   Micro Results: No results found for this or any previous visit (from the past 240 hour(s)). Studies/Results: Dg Chest 2  View  03/19/2013   *RADIOLOGY REPORT*  Clinical Data: Chest pain  CHEST - 2 VIEW  Comparison: None.  Findings: Cardiac shadow is enlarged.  A defibrillator is noted. The lungs are clear.  No acute bony abnormality is noted.  IMPRESSION: No acute abnormalities seen.   Original Report Authenticated By: Alcide Clever, M.D.   Medications: I have reviewed the patient's current medications. Scheduled Meds: . acetaZOLAMIDE  250 mg Oral TID  . aspirin EC  81 mg Oral Daily  . atorvastatin  80 mg Oral q1800  . carvedilol  25 mg Oral BID WC  . furosemide  40 mg Intravenous BID  . insulin aspart  0-15 Units Subcutaneous TID WC  . insulin aspart  0-5 Units Subcutaneous QHS  . insulin glargine  25 Units Subcutaneous QHS  . levETIRAcetam  1,000 mg Oral BID  . pantoprazole  40 mg Oral Daily   Continuous Infusions: . sodium chloride 10 mL/hr (03/19/13 2016)   PRN Meds:.acetaminophen, nicotine, ondansetron (ZOFRAN) IV Assessment/Plan: Principal Problem:   Anginal chest pain at rest Active Problems:   Hypertension   Diabetes mellitus without complication   Tobacco abuse   Congestive heart failure The patient is a 67 yo woman, history of CAD s/p MI's, CHF s/p ICD, HTN, HL, FH of MI, presenting with chest pain.  # Anginal Chest pain - The patient presents with substernal chest pain, worsened by exertion, previously improved by rest, now occurring at rest. Troponin negative, EKG shows TWI in inferolateral leads (?new vs old). The patient has risk factors of HTN, HL, prior MI, FH, as well as a TIMI score of 5 (high risk, for aspirin use, 2 anginal episodes, age, CAD risk factors, and history of CAD, +/- EKG changes if TWI are new). As such, this patient is at high risk for ACS. Differential also includes CHF exacerbation (moderate pro-BNP elevation, LE edema, orthopnea, PND). Less likely possibilities include GERD vs MSK. Unlikely PE (Wells score = 1.5, low prob).  -Cardiology Consult Dr. Anne Fu - stop heparin  drip, no cardi ac testing at this time   -Old records obtained - scanned into epic - t wave inversions on EKG present from previous 01/20/13 , no other cardiac records -give aspirin, atorvastatin, coreg  -cycle troponins - negatve - EKG 8/29 - unchanged from  8/28  #Somnolence - h/o OSA, COPD, epilepsy, obesity hypoventilation syndrome, and C02 narcosis in past  -Obtain ABG, on diamax 250mg  TID -Obtain UDS - supplemental oxygen, CPAP -TSH -consider EEG   #Suicide Ideation - Pt with history of depression, reported to be taking cymbalta  - Sitter -  Psych consult Dr. Lolly Mustache - Start Abilify 2 mg daily if not medically contraindicated, continue sitter for patient safety. Consultation liaison services will follow up and monitor the progress.    # Acute on Chronic CHF - The patient has a history of CHF, and presents with moderately elevated pro-BNP (910), 5-pillow orthopnea, PND, and LE edema, in the setting of lasix non-compliance. This likely represents a mild CHF exacerbation.  -IV lasix IV 40 BID - insert foley  -strict I & Os -Daily weight - BMP and MG BID -continue coreg due to concern for ACS  -losartan and hydralazine (no nitrate, allergy) on home med list, but pt notes non-compliance. BP borderline low. Will start coreg first, and add ARB if BP tolerates  # DM2 - The patient has a history of DM. CBG 169 on admission, unclear if patient compliant with meds, though previously on Lantus 26, linagliptin.  -start SSI moderate, can add Lantus depending on glucose control  # OSA - pt notes non-compliance with CPAP at home  -CPAP qhs while hospitalized  # Homelessness - living with niece-in-law, unable to afford medications  -consult CSW, CM  # Hypertension - chronic, stable  -continue coreg  -hold hydralazine, losartan for now, can re-add as BP tolerates  # Hyperlipidemia - chronic, stable  -start atorvastatin  # Tobacco abuse - encourage cessation  -ordered tobacco abuse counseling   -nicotine patch daily prn  # Epilepsy - chronic, stable. Pt states she cannot remember her last seizure  -continue AED's  # Prophy - heparin      Dispo: Disposition is deferred at this time, awaiting improvement of current medical problems.  Anticipated discharge in approximately  day(s).   The patient does have a current PCP (No primary provider on file.) and does need an [facility] hospital follow-up appointment after discharge.  The patient does have transportation limitations that hinder transportation to clinic appointments.  .Services Needed at time of discharge: Y = Yes, Blank = No PT:   OT:   RN:   Equipment:   Other:     LOS: 1 day   Otis Brace, MD 03/20/2013, 4:27 PM

## 2013-03-20 NOTE — Progress Notes (Signed)
1830 Transferred pt to 2900 via bed by CN and NT.

## 2013-03-20 NOTE — H&P (Signed)
IM ATTENDING H&P  Date: 03/20/2013  Patient name: Kelly Phelps  Medical record number: [id number]  Date of birth: [birth year]   This patient has been seen and the plan of care was discussed with the house staff. Please see their note for complete details. I concur with their findings with the following additions/corrections:  I saw [name], during my exam she was asleep for most of it, but would awake to voice and answer some questions.  Briefly, she is a 67yo woman with PMH of DM2, HLD, epilepsy, afib, CHF an dsleep apnea who presented with substernal chest pain/tightness for 5 days, but acutely worsening for the last 2 days.  When I saw her, she noted that she was no longer having chest pain.  However, the pain was concerning in that it was 10/10, radiating down the left arm and associated with diaphoresis, weakness, dyspnea and nausea.  It was also noted to be worse with activity.  She has had pain like this before, but worsened.  She has a history of Heart disease and smoking.  She further has a family history of heart disease.  She has recently been homeless and has no had medications for her CHF including lasix.  On exam, she was somnolent, awake to voice, she answered some questions including reporting that she was no longer having pain or SI.  She had normal heart sounds, no murmur heard, no wheezing or rales, abdomen soft and non tender, 1+ edema to bilateral LE with good pulses.  One labs, she had an elevated glucose and mildly low Hgb and Plt.  Her pro BNP was 910.  CXR showed no acute abnormalities.  She has TWI on EKG in the inferolateral leads.   She was admitted for concern for unstable angina.  She has been placed on a heparin drip, given aspirin and statin.  CE will be checked.  If she is on sedating pain controlling medications, will hold those for now.  There was also concern for acute CHF as she has been out of her diuretic and has orthopnea and PND.  She will be placed on IV lasix with  close monitoring of her renal function, along with coreg.   For her other issues, please see resident note.   Inez Catalina, MD 03/20/2013, 3:18 PM

## 2013-03-20 NOTE — Progress Notes (Addendum)
1530 Seen by Psychiatry consult. Pt tearful. Denied attempt to commit suicide or harm other people

## 2013-03-20 NOTE — Consult Note (Signed)
PULMONARY  / CRITICAL CARE MEDICINE  Name: Kelly Phelps MRN: [id number] DOB: 05/12/1946    ADMISSION DATE:  03/19/2013 CONSULTATION DATE: 03/20/13  REFERRING MD :  Kelly Peaches, MD PRIMARY SERVICE: Medicine  CHIEF COMPLAINT: Chest pain  BRIEF PATIENT DESCRIPTION: 67 year old African American female admitted for evaluation of chest pain. Pulmonary consult requested 2 to hypercarbic respiratory failure. Patient has a history of obstructive sleep apnea and obesity hypoventilation syndrome noncompliant with BiPAP.  SIGNIFICANT EVENTS / STUDIES:  03/20/13>> ABG results: 7.26/62/76; placed on BiPAP.  CT head>>   LINES / TUBES: Foley 03/19/13>>  CULTURES:  none  ANTIBIOTICS:  none  HISTORY OF PRESENT ILLNESS:  67 yo AA female with pmh of hypertension, T2DM, HL, epilepsy, afib (on aspirin), CHF with AICD placement Nov 2011, CAD s/p MI's, sleep apnea, and tobacco use who presents with substernal chest pain and tightness for past 5 days that has worsened 2 days ago. Pain is 10/10 and radiates to left arm with associated diaphoresis, weakness, palpitations, dyspnea, cough, nausea, and vomiting. Pain is worse with activity such as walking and climbing stairs and improves with rest. Episode can last for about up to an 1 hour.   Admitted on 8/28 to the internal medicine service, for evaluation of possible coronary etiology of chest pain. Earlier today noted to be encephalopathic poorly arousable, ABG done at that time revealed hypercarbia patient was placed on BiPAP and pulmonary consult requested.   PAST MEDICAL HISTORY :  Past Medical History  Diagnosis Date  . Atrial fibrillation     Pt denies history of this, unclear why this is on patient's problem list  . Hypertension   . Diabetes mellitus without complication   . CAD (coronary artery disease)     History of 2 prior MI's, the most recent of which was in 2013  . Hyperlipemia   . GERD (gastroesophageal reflux disease)   . DVT (deep venous  thrombosis)     History of 1 DVT around 2012, unclear if provoked or unprovoked. Was previously on Coumadin, but this was stopped due to a GI bleed  . Congestive heart failure     With ICD in place since 05/2010  . Epilepsy   . Obstructive sleep apnea    History reviewed. No pertinent past surgical history. Prior to Admission medications   Medication Sig Start Date End Date Taking? Authorizing Provider  acetaZOLAMIDE (DIAMOX) 250 MG tablet Take 250 mg by mouth 3 (three) times daily.   Yes Historical Provider, MD  aspirin 81 MG tablet Take 81 mg by mouth daily.   Yes Historical Provider, MD  carvedilol (COREG) 25 MG tablet Take 25 mg by mouth 2 (two) times daily with a meal.   Yes Historical Provider, MD  DULoxetine (CYMBALTA) 60 MG capsule Take 60 mg by mouth daily.   Yes Historical Provider, MD  furosemide (LASIX) 40 MG tablet Take 40 mg by mouth.   Yes Historical Provider, MD  hydrALAZINE (APRESOLINE) 25 MG tablet Take 25 mg by mouth 3 (three) times daily.   Yes Historical Provider, MD  insulin glargine (LANTUS) 100 UNIT/ML injection Inject 26 Units into the skin at bedtime.    Yes Historical Provider, MD  levETIRAcetam (KEPPRA) 1000 MG tablet Take 1,000 mg by mouth 2 (two) times daily.   Yes Historical Provider, MD  linagliptin (TRADJENTA) 5 MG TABS tablet Take 5 mg by mouth daily.   Yes Historical Provider, MD  losartan (COZAAR) 25 MG tablet Take 25 mg by mouth daily.  Yes Historical Provider, MD  meloxicam (MOBIC) 15 MG tablet Take 15 mg by mouth daily.   Yes Historical Provider, MD  omeprazole (PRILOSEC) 20 MG capsule Take 20 mg by mouth daily.   Yes Historical Provider, MD  potassium chloride (MICRO-K) 10 MEQ CR capsule Take 10 mEq by mouth 2 (two) times daily.   Yes Historical Provider, MD  rosuvastatin (CRESTOR) 20 MG tablet Take 20 mg by mouth daily.   Yes Historical Provider, MD  aspirin 325 MG tablet Take 325 mg by mouth daily.    Historical Provider, MD   Allergies  Allergen  Reactions  . Hydrogen Peroxide Rash  . Iodine Rash  . Nitroglycerin Other (See Comments)    Headache only  . Penicillins Anxiety    "makes me paranoid, like I'm on marijuana"  . Phenobarbital Rash    FAMILY HISTORY:  Family History  Problem Relation Age of Onset  . Heart attack Daughter 51  . Heart attack Sister 23  . Heart attack Mother 43  . Renal Disease Sister     ESRD, on HD  . Heart failure Daughter    SOCIAL HISTORY:  reports that she has been smoking.  She does not have any smokeless tobacco history on file. She reports that she does not drink alcohol or use illicit drugs.  REVIEW OF SYSTEMS:  Positives in BOLD  Constitutional: Negative for fever, chills, weight loss, malaise/fatigue and diaphoresis.  HENT: Negative for hearing loss, ear pain, nosebleeds, congestion, sore throat, neck pain, tinnitus and ear discharge.  Eyes: Negative for blurred vision, double vision, photophobia, pain, discharge and redness.  Respiratory: cough, hemoptysis, sputum production, shortness of breath, wheezing and no stridor.  Cardiovascular: Negative for chest pain, palpitations, orthopnea, claudication, leg swelling and PND.  Gastrointestinal: Negative for heartburn, nausea, vomiting, abdominal pain, diarrhea, constipation, blood in stool and melena.  Genitourinary: Negative for dysuria, urgency, frequency, hematuria and flank pain.  Musculoskeletal: Negative for myalgias, back pain, joint pain and falls.  Skin: Negative for itching and rash.  Neurological: Negative for dizziness, tingling, tremors, sensory change, speech change, focal weakness, seizures, loss of consciousness, weakness and headaches.  Endo/Heme/Allergies: Negative for environmental allergies and polydipsia. Does not bruise/bleed easily \  SUBJECTIVE:   VITAL SIGNS: Temp:  [97.6 F (36.4 C)-98.2 F (36.8 C)] 98.2 F (36.8 C) (08/29 1531) Pulse Rate:  [59-70] 59 (08/29 2007) Resp:  [15-21] 21 (08/29 2007) BP:  (107-126)/(49-57) 122/55 mmHg (08/29 1845) SpO2:  [92 %-100 %] 94 % (08/29 2007) Weight:  [128.5 kg (283 lb 4.7 oz)] 128.5 kg (283 lb 4.7 oz) (08/29 0541) HEMODYNAMICS:   VENTILATOR SETTINGS:   INTAKE / OUTPUT: Intake/Output     08/29 0701 - 08/30 0700   P.O. 560   I.V. (mL/kg)    Total Intake(mL/kg) 560 (4.4)   Urine (mL/kg/hr) 1475 (0.9)   Total Output 1475   Net -915         PHYSICAL EXAMINATION: General: Obese, Well developed, well nourished, in no acute distress  Head: Eyes PERRLA, No xanthomas. Normal cephalic and atramatic  Lungs: Clear bilaterally to auscultation and percussion, but diminished bilaterally.  Heart: HRRR S1 S2 Pulses are 2+ & equal.  No carotid bruit. No JVD. No abdominal bruits. No femoral bruits.  Abdomen: Bowel sounds are positive, abdomen soft and non-tender without masses  Extremities: No clubbing, cyanosis or edema. DP +1  Neuro: Alert and oriented X 3. No focal motor or sensory deficits Psych: Good affect, responds appropriately  LABS:  CBC Recent Labs     03/19/13  1534  03/20/13  0510  WBC  4.5  4.9  HGB  11.0*  11.3*  HCT  36.0  36.9  PLT  140*  117*   Coag's Recent Labs     03/19/13  2024  03/19/13  2236  APTT  20*   --   INR  0.98  0.99   BMET Recent Labs     03/19/13  1534  03/20/13  0510  03/20/13  1818  NA  139  137  137  K  4.0  4.5  3.9  CL  106  104  101  CO2  23  23  30   BUN  16  16  18   CREATININE  0.93  0.92  1.06  GLUCOSE  169*  111*  106*   Electrolytes Recent Labs     03/19/13  1534  03/19/13  2236  03/20/13  0510  03/20/13  1818  CALCIUM  9.0   --   8.9  9.0  MG   --   1.9   --   1.8   Sepsis Markers No results found for this basename: LACTICACIDVEN, PROCALCITON, O2SATVEN,  in the last 72 hours ABG Recent Labs     03/20/13  1600  PHART  7.267*  PCO2ART  62.4*  PO2ART  76.6*   Liver Enzymes Recent Labs     03/19/13  1534  AST  16  ALT  7  ALKPHOS  52  BILITOT  0.4  ALBUMIN  3.2*    Cardiac Enzymes Recent Labs     03/19/13  1535  03/19/13  2236  03/20/13  0510  TROPONINI   --   <0.30  <0.30  PROBNP  910.9*   --    --    Glucose Recent Labs     03/19/13  2112  03/20/13  0628  03/20/13  1130  03/20/13  1702  GLUCAP  88  119*  128*  105*    Imaging Dg Chest 2 View  03/19/2013   *RADIOLOGY REPORT*  Clinical Data: Chest pain  CHEST - 2 VIEW  Comparison: None.  Findings: Cardiac shadow is enlarged.  A defibrillator is noted. The lungs are clear.  No acute bony abnormality is noted.  IMPRESSION: No acute abnormalities seen.   Original Report Authenticated By: Alcide Clever, M.D.     CXR: poor inspiratory effort, perihilar crowding. No gross infiltrate or effusion  ASSESSMENT / PLAN:  PULMONARY A: Tachycardic respiratory failure, likely secondary to obstructive sleep apnea and obesity hyperventilation syndrome ;noncompliance and noninvasive positive pressure ventilation. P:   -Continue BiPAP, repeat ABG in the morning -Monitor respiratory status  CARDIOVASCULAR A: Chest pain, congestive heart failure P:  -Agree with current plan as per primary team and cardiology. -consider d-dimer for evaluation of noncardiac etiology of chest pain the patient's symptoms are more typical of anginal syndrome.  RENAL A:  No acute issues P:   -Monitor renal function and urine output.  GASTROINTESTINAL A:  No acute issues P:   - GI prophylaxis with Protonix  HEMATOLOGIC A:  Normocytic anemia P:  -Consider checking fecal occult blood, iron studies, vitamin B12 and folate. -Continue to monitor  INFECTIOUS A:  No acute issues P:     ENDOCRINE A:  Type 2 diabetes P:   -Submental insulin to maintain glycemic control. Adjust PRN  NEUROLOGIC A:  Encephalopathy, possible seizure disorder. P:   -Followup CT  head, monitor mental status. Currently awake and alert, oriented x3. Cephalopathy to be likely related to acute hypercarbia. Followup neurology -Continue  Keppra -Consider EEG  TODAY'S SUMMARY: Acute onset encephalopathy, found to be hypercarbic. Placed on BiPAP with resolution of symptoms. CT head pending possible seizures on Keppra.  I have personally obtained a history, examined the patient, evaluated laboratory and imaging results, formulated the assessment and plan and placed orders. CRITICAL CARE: The patient is critically ill with multiple organ systems failure and requires high complexity decision making for assessment and support, frequent evaluation and titration of therapies, application of advanced monitoring technologies and extensive interpretation of multiple databases. Critical Care Time devoted to patient care services described in this note is 30 minutes.    Pulmonary and Critical Care Medicine [facility] Pager: [phone]  03/20/2013, 8:22 PM

## 2013-03-20 NOTE — Progress Notes (Addendum)
S: patient has been very sleepy all day. Difficulty to be arousal until the evening. She is awake and eating now. Able to communicate with me well now. Patient states that she did not sleep well last night. Also states that she becomes sleepy after each seizure activity. She reports "sleep" has always been the symptoms of her seizure activity. Denies limbs jerky-like movement in the past.     She has a Insurance account manager Dr. Satira Sark in [place]. But she has not seen him for a while. She states that she has not taken her Keppra for at least one month. Last seizure activity was 3 weeks ago per patient report.     She has a history of COPD and OSA. She is noncompliant with CPAP  O:  General: sleepy Lungs: diminished Neuro: drowsy Mental Status: drowsy&O x 3,  CN: PERRL; no RAPD; EOMI; facial sensation intact; masseter full Strength and sensation intact.   A/P - acute encephalopathy         DDX could respiratory failure vs seizure vs acute intracranial abnormality. ABG    Component Value Date/Time   PHART 7.267* 03/20/2013 1600   PCO2ART 62.4* 03/20/2013 1600   PO2ART 76.6* 03/20/2013 1600   HCO3 27.6* 03/20/2013 1600   TCO2 29.5 03/20/2013 1600   O2SAT 96.1 03/20/2013 1600   -  Transfer to SDU>>>2922 -  Bipap  now -  Call PCCM for consultation>>spoke with Dr. Vassie Loll.  -  will consult Neurology for ? seizures  -  head CT stat.     Dede Query, MD  PGY-3 INTS 6:00 PM

## 2013-03-20 NOTE — Progress Notes (Signed)
ANTICOAGULATION CONSULT NOTE - Follow Up Consult  Pharmacy Consult for heparin Indication: chest pain/ACS  Labs:  Recent Labs  03/19/13 1534 03/19/13 2024 03/19/13 2236 03/20/13 0510  HGB 11.0*  --   --  11.3*  HCT 36.0  --   --  36.9  PLT 140*  --   --  PENDING  APTT  --  20*  --   --   LABPROT  --  12.8 12.9  --   INR  --  0.98 0.99  --   HEPARINUNFRC  --   --   --  <0.10*  CREATININE 0.93  --   --   --   TROPONINI  --   --  <0.30  --     Assessment: 67yo female undetectable on heparin with initial dosing for CP.  Goal of Therapy:  Heparin level 0.3-0.7 units/ml   Plan:  Will rebolus with heparin 3000 units and increase gtt by 4 units/kg/hr to 1500 units/hr and check level in 6hr.  Vernard Gambles, PharmD, BCPS  03/20/2013,6:29 AM

## 2013-03-20 NOTE — Progress Notes (Signed)
eLink Physician-Brief Progress Note Patient Name: Kelly Phelps DOB: 11-22-1945 MRN: [id number]  Date of Service  03/20/2013   HPI/Events of Note  Best Practice   eICU Interventions  SCDs ordered for DVT propy   Intervention Category Intermediate Interventions: Best-practice therapies (e.g. DVT, beta blocker, etc.)  [name] 03/20/2013, 9:51 PM

## 2013-03-21 ENCOUNTER — Inpatient Hospital Stay (HOSPITAL_COMMUNITY): Payer: Medicare Other

## 2013-03-21 DIAGNOSIS — J9620 Acute and chronic respiratory failure, unspecified whether with hypoxia or hypercapnia: Secondary | ICD-10-CM

## 2013-03-21 DIAGNOSIS — I509 Heart failure, unspecified: Secondary | ICD-10-CM

## 2013-03-21 DIAGNOSIS — E662 Morbid (severe) obesity with alveolar hypoventilation: Secondary | ICD-10-CM

## 2013-03-21 DIAGNOSIS — R079 Chest pain, unspecified: Secondary | ICD-10-CM

## 2013-03-21 DIAGNOSIS — G40909 Epilepsy, unspecified, not intractable, without status epilepticus: Secondary | ICD-10-CM

## 2013-03-21 DIAGNOSIS — I5023 Acute on chronic systolic (congestive) heart failure: Secondary | ICD-10-CM

## 2013-03-21 DIAGNOSIS — I517 Cardiomegaly: Secondary | ICD-10-CM

## 2013-03-21 DIAGNOSIS — R0609 Other forms of dyspnea: Secondary | ICD-10-CM

## 2013-03-21 LAB — BASIC METABOLIC PANEL
CO2: 27 mEq/L (ref 19–32)
Calcium: 8.7 mg/dL (ref 8.4–10.5)
Creatinine, Ser: 1.06 mg/dL (ref 0.50–1.10)
GFR calc non Af Amer: 52 mL/min — ABNORMAL LOW (ref >90)
Glucose, Bld: 81 mg/dL (ref 70–99)

## 2013-03-21 LAB — POCT I-STAT 3, ART BLOOD GAS (G3+)
Acid-base deficit: 1 mmol/L (ref 0.0–2.0)
Bicarbonate: 28.9 mEq/L — ABNORMAL HIGH (ref 20.0–24.0)
O2 Saturation: 98 %
Patient temperature: 98.6
TCO2: 33 mmol/L (ref 0–100)
pCO2 arterial: 76.9 mmHg (ref 35.0–45.0)
pH, Arterial: 7.208 — ABNORMAL LOW (ref 7.350–7.450)
pO2, Arterial: 84 mmHg (ref 80.0–100.0)

## 2013-03-21 LAB — GLUCOSE, CAPILLARY: Glucose-Capillary: 99 mg/dL (ref 70–99)

## 2013-03-21 LAB — MAGNESIUM: Magnesium: 1.9 mg/dL (ref 1.5–2.5)

## 2013-03-21 LAB — TSH: TSH: 0.996 u[IU]/mL (ref 0.350–4.500)

## 2013-03-21 MED ORDER — SODIUM CHLORIDE 0.9 % IJ SOLN
10.0000 mL | Freq: Two times a day (BID) | INTRAMUSCULAR | Status: DC
  Administered 2013-03-21: 10 mL
  Administered 2013-03-22: 20 mL
  Administered 2013-03-23: 10 mL

## 2013-03-21 MED ORDER — INSULIN GLARGINE 100 UNIT/ML ~~LOC~~ SOLN
15.0000 [IU] | Freq: Every day | SUBCUTANEOUS | Status: DC
  Administered 2013-03-21 – 2013-03-24 (×4): 15 [IU] via SUBCUTANEOUS
  Filled 2013-03-21 (×5): qty 0.15

## 2013-03-21 MED ORDER — TRAMADOL HCL 50 MG PO TABS
50.0000 mg | ORAL_TABLET | Freq: Four times a day (QID) | ORAL | Status: DC | PRN
  Administered 2013-03-21 – 2013-03-24 (×2): 50 mg via ORAL
  Filled 2013-03-21 (×2): qty 1

## 2013-03-21 MED ORDER — SODIUM CHLORIDE 0.9 % IJ SOLN
10.0000 mL | INTRAMUSCULAR | Status: DC | PRN
  Administered 2013-03-22 – 2013-03-25 (×4): 20 mL

## 2013-03-21 MED ORDER — LISINOPRIL 5 MG PO TABS
5.0000 mg | ORAL_TABLET | Freq: Every day | ORAL | Status: DC
  Administered 2013-03-21 – 2013-03-25 (×5): 5 mg via ORAL
  Filled 2013-03-21 (×5): qty 1

## 2013-03-21 MED ORDER — ONDANSETRON 4 MG PO TBDP
4.0000 mg | ORAL_TABLET | Freq: Three times a day (TID) | ORAL | Status: DC | PRN
  Filled 2013-03-21: qty 1

## 2013-03-21 MED ORDER — IPRATROPIUM BROMIDE 0.02 % IN SOLN
0.5000 mg | Freq: Four times a day (QID) | RESPIRATORY_TRACT | Status: DC
  Administered 2013-03-21 – 2013-03-24 (×11): 0.5 mg via RESPIRATORY_TRACT
  Filled 2013-03-21 (×12): qty 2.5

## 2013-03-21 MED ORDER — HEPARIN SODIUM (PORCINE) 5000 UNIT/ML IJ SOLN
5000.0000 [IU] | Freq: Three times a day (TID) | INTRAMUSCULAR | Status: DC
  Administered 2013-03-21 – 2013-03-25 (×10): 5000 [IU] via SUBCUTANEOUS
  Filled 2013-03-21 (×14): qty 1

## 2013-03-21 MED ORDER — ALBUTEROL SULFATE (5 MG/ML) 0.5% IN NEBU
2.5000 mg | INHALATION_SOLUTION | Freq: Four times a day (QID) | RESPIRATORY_TRACT | Status: DC
  Administered 2013-03-21 – 2013-03-24 (×11): 2.5 mg via RESPIRATORY_TRACT
  Filled 2013-03-21 (×11): qty 0.5

## 2013-03-21 MED ORDER — ALBUTEROL SULFATE (5 MG/ML) 0.5% IN NEBU
2.5000 mg | INHALATION_SOLUTION | RESPIRATORY_TRACT | Status: DC | PRN
  Administered 2013-03-24: 2.5 mg via RESPIRATORY_TRACT
  Filled 2013-03-21: qty 0.5

## 2013-03-21 MED ORDER — POTASSIUM CHLORIDE CRYS ER 20 MEQ PO TBCR
EXTENDED_RELEASE_TABLET | ORAL | Status: AC
  Filled 2013-03-21: qty 1

## 2013-03-21 NOTE — Progress Notes (Signed)
Subjective: Patient continues to be lethargic but is easily arousable and follows commands.  Expresses wishes for needs such as water, etc.  No seizures noted by nursing.  On Keppra.  Despite BiPAP remains with pH of 7.2.  Objective: Current vital signs: BP 139/55  Pulse 59  Temp(Src) 97.8 F (36.6 C) (Oral)  Resp 17  Ht 5\' 2"  (1.575 m)  Wt 127.8 kg (281 lb 12 oz)  BMI 51.52 kg/m2  SpO2 98% Vital signs in last 24 hours: Temp:  [97.8 F (36.6 C)-98.3 F (36.8 C)] 97.8 F (36.6 C) (08/30 0743) Pulse Rate:  [55-69] 59 (08/30 0900) Resp:  [14-28] 17 (08/30 0900) BP: (99-146)/(37-72) 139/55 mmHg (08/30 0900) SpO2:  [93 %-100 %] 98 % (08/30 0900) Weight:  [127.8 kg (281 lb 12 oz)] 127.8 kg (281 lb 12 oz) (08/30 0541)  Intake/Output from previous day: 08/29 0701 - 08/30 0700 In: 560 [P.O.:560] Out: 1925 [Urine:1925] Intake/Output this shift:   Nutritional status: Carb Control  Neurologic Exam: Mental Status:  Alert, oriented, thought content appropriate. Speech fluent without evidence of aphasia. Able to follow 3 step commands without difficulty.  Cranial Nerves:  II: Discs flat bilaterally; Visual fields grossly normal, pupils equal, round, reactive to light and accommodation  III,IV, VI: ptosis not present, extra-ocular motions intact bilaterally  V,VII: smile symmetric, facial light touch sensation normal bilaterally  VIII: hearing normal bilaterally  IX,X: gag reflex present  XI: bilateral shoulder shrug  XII: midline tongue extension  Motor:  Right : Upper extremity 5/5        Left: Upper extremity 5/5   Lower extremity 5/5     Lower extremity 5/5  Tone and bulk:normal tone throughout; no atrophy noted  Sensory: Pinprick and light touch intact throughout, bilaterally  Deep Tendon Reflexes:  1+ all over  Plantars:  Right: downgoing     Left: downgoing   Lab Results: Basic Metabolic Panel:  Recent Labs Lab 03/19/13 1534 03/19/13 2236 03/20/13 0510  03/20/13 1818 03/20/13 2024 03/21/13 0650  NA 139  --  137 137 137 136  K 4.0  --  4.5 3.9 3.8 4.5  CL 106  --  104 101 101 101  CO2 23  --  23 30 29 27   GLUCOSE 169*  --  111* 106* 107* 81  BUN 16  --  16 18 19 18   CREATININE 0.93  --  0.92 1.06 1.05 1.06  CALCIUM 9.0  --  8.9 9.0 9.0 8.7  MG  --  1.9  --  1.8 1.9 1.9    Liver Function Tests:  Recent Labs Lab 03/19/13 1534 03/20/13 2024  AST 16 12  ALT 7 8  ALKPHOS 52 53  BILITOT 0.4 0.3  PROT 6.6 6.4  ALBUMIN 3.2* 3.2*    Recent Labs Lab 03/19/13 1534  LIPASE 48   No results found for this basename: AMMONIA,  in the last 168 hours  CBC:  Recent Labs Lab 03/19/13 1534 03/20/13 0510 03/20/13 2024  WBC 4.5 4.9 4.6  NEUTROABS 2.7  --  1.7  HGB 11.0* 11.3* 10.7*  HCT 36.0 36.9 35.3*  MCV 84.7 86.2 87.2  PLT 140* 117* 163    Cardiac Enzymes:  Recent Labs Lab 03/19/13 2236 03/20/13 0510  TROPONINI <0.30 <0.30    Lipid Panel:  Recent Labs Lab 03/20/13 0510  CHOL 239*  TRIG 101  HDL 63  CHOLHDL 3.8  VLDL 20  LDLCALC 045*    CBG:  Recent Labs  Lab 03/19/13 2112 03/20/13 0628 03/20/13 1130 03/20/13 1702 03/21/13 0747  GLUCAP 88 119* 128* 105* 82    Microbiology: Results for orders placed during the hospital encounter of 03/19/13  MRSA PCR SCREENING     Status: None   Collection Time    03/20/13  6:52 PM      Result Value Range Status   MRSA by PCR NEGATIVE  NEGATIVE Final   Comment:            The GeneXpert MRSA Assay (FDA     approved for NASAL specimens     only), is one component of a     comprehensive MRSA colonization     surveillance program. It is not     intended to diagnose MRSA     infection nor to guide or     monitor treatment for     MRSA infections.    Coagulation Studies:  Recent Labs  03/19/13 2024 03/19/13 2236  LABPROT 12.8 12.9  INR 0.98 0.99    Imaging: Dg Chest 2 View  03/19/2013   *RADIOLOGY REPORT*  Clinical Data: Chest pain  CHEST - 2  VIEW  Comparison: None.  Findings: Cardiac shadow is enlarged.  A defibrillator is noted. The lungs are clear.  No acute bony abnormality is noted.  IMPRESSION: No acute abnormalities seen.   Original Report Authenticated By: Alcide Clever, M.D.   Ct Head Wo Contrast  03/20/2013   *RADIOLOGY REPORT*  Clinical Data: Acute encephalopathy.  History of epilepsy.  CT HEAD WITHOUT CONTRAST  Technique:  Contiguous axial images were obtained from the base of the skull through the vertex without contrast.  Comparison: None.  Findings:  Skull:No acute osseous abnormality.  Orbits: Symmetric proptosis.  There is related herniation of the lacrimal glands.  There is slight to enlargement of the medial recti relative to the lateral recti, raising the possibility of Grave's orbitopathy.  Bilateral cataract resection.  Brain: No evidence of acute abnormality, such as acute infarction, hemorrhage, hydrocephalus, or mass lesion/mass effect.  IMPRESSION:  1.  No evidence of acute intracranial disease. 2.  Proptosis.   Original Report Authenticated By: Tiburcio Pea    Medications:  I have reviewed the patient's current medications. Scheduled: . aspirin EC  81 mg Oral Daily  . atorvastatin  80 mg Oral q1800  . carvedilol  25 mg Oral BID WC  . furosemide  40 mg Intravenous BID  . insulin aspart  0-15 Units Subcutaneous TID WC  . insulin aspart  0-5 Units Subcutaneous QHS  . insulin glargine  25 Units Subcutaneous QHS  . levETIRAcetam  1,000 mg Oral BID  . lisinopril  5 mg Oral Daily  . pantoprazole  40 mg Oral Daily  . potassium chloride SA        Assessment/Plan: 67 year old with altered mental status.  Do not at this point feel that her altered mental status is secondary to seizure activity.  Patient back on her Keppra, arousable and able to follow commands.  Suspect mental status changes more metabolic in etiology.  CT of the head reviewed and shows no acute changes.  Recommendations: 1.  Continue Keppra at  current dose 2.  No further neurologic testing recommended at this time.     LOS: 2 days   Thana Farr, MD [facility] [phone] 03/21/2013  10:59 AM

## 2013-03-21 NOTE — Progress Notes (Addendum)
Subjective: Patient is still somnolent.  She was treated with BiPAP last night PCCM.  Objective: Vital signs in last 24 hours: Filed Vitals:   03/21/13 1202 03/21/13 1452 03/21/13 1508 03/21/13 1600  BP: 150/68   144/51  Pulse: 54  64 58  Temp: 98.4 F (36.9 C)   99.2 F (37.3 C)  TempSrc: Oral   Axillary  Resp: 21  20 24   Height:      Weight:      SpO2: 95% 93% 97% 98%   Weight change: -3.5 oz (-0.1 kg)  Intake/Output Summary (Last 24 hours) at 03/21/13 1750 Last data filed at 03/21/13 0541  Gross per 24 hour  Intake    240 ml  Output    725 ml  Net   -485 ml   General: somnolent Neck soft, unable to assessJVDdue to body habitus. Lungs: CTA B/L Heart: RRR, No M/G/R Abdomen: central obesity, soft, nontender, bowel sounds x4 active. Ext: trace edema Lab Results: Basic Metabolic Panel:  Recent Labs Lab 03/20/13 2024 03/21/13 0650  Lee Kalt 137 136  K 3.8 4.5  CL 101 101  CO2 29 27  GLUCOSE 107* 81  BUN 19 18  CREATININE 1.05 1.06  CALCIUM 9.0 8.7  MG 1.9 1.9   Liver Function Tests:  Recent Labs Lab 03/19/13 1534 03/20/13 2024  AST 16 12  ALT 7 8  ALKPHOS 52 53  BILITOT 0.4 0.3  PROT 6.6 6.4  ALBUMIN 3.2* 3.2*    Recent Labs Lab 03/19/13 1534  LIPASE 48   CBC:  Recent Labs Lab 03/19/13 1534 03/20/13 0510 03/20/13 2024  WBC 4.5 4.9 4.6  NEUTROABS 2.7  --  1.7  HGB 11.0* 11.3* 10.7*  HCT 36.0 36.9 35.3*  MCV 84.7 86.2 87.2  PLT 140* 117* 163   Cardiac Enzymes:  Recent Labs Lab 03/19/13 2236 03/20/13 0510  TROPONINI <0.30 <0.30   BNP:  Recent Labs Lab 03/19/13 1535  PROBNP 910.9*   CBG:  Recent Labs Lab 03/19/13 2112 03/20/13 0628 03/20/13 1130 03/20/13 1702 03/21/13 0747 03/21/13 1206  GLUCAP 88 119* 128* 105* 82 117*   Hemoglobin A1C:  Recent Labs Lab 03/19/13 2236  HGBA1C 6.8*   Fasting Lipid Panel:  Recent Labs Lab 03/20/13 0510  CHOL 239*  HDL 63  LDLCALC 156*  TRIG 101  CHOLHDL 3.8   Thyroid  Function Tests:  Recent Labs Lab 03/20/13 2024  TSH 0.996   Coagulation:  Recent Labs Lab 03/19/13 2024 03/19/13 2236  LABPROT 12.8 12.9  INR 0.98 0.99    Drugs of Abuse     Component Value Date/Time   LABOPIA NONE DETECTED 03/20/2013 1628   COCAINSCRNUR NONE DETECTED 03/20/2013 1628   LABBENZ NONE DETECTED 03/20/2013 1628   AMPHETMU NONE DETECTED 03/20/2013 1628   THCU NONE DETECTED 03/20/2013 1628   LABBARB NONE DETECTED 03/20/2013 1628    Urinalysis:  Recent Labs Lab 03/20/13 1100  COLORURINE YELLOW  LABSPEC 1.013  PHURINE 5.5  GLUCOSEU NEGATIVE  HGBUR NEGATIVE  BILIRUBINUR NEGATIVE  KETONESUR NEGATIVE  PROTEINUR NEGATIVE  UROBILINOGEN 1.0  NITRITE NEGATIVE  LEUKOCYTESUR NEGATIVE   Micro Results: Recent Results (from the past 240 hour(s))  MRSA PCR SCREENING     Status: None   Collection Time    03/20/13  6:52 PM      Result Value Range Status   MRSA by PCR NEGATIVE  NEGATIVE Final   Comment:  The GeneXpert MRSA Assay (FDA     approved for NASAL specimens     only), is one component of a     comprehensive MRSA colonization     surveillance program. It is not     intended to diagnose MRSA     infection nor to guide or     monitor treatment for     MRSA infections.   Studies/Results: Ct Head Wo Contrast  03/20/2013   *RADIOLOGY REPORT*  Clinical Data: Acute encephalopathy.  History of epilepsy.  CT HEAD WITHOUT CONTRAST  Technique:  Contiguous axial images were obtained from the base of the skull through the vertex without contrast.  Comparison: None.  Findings:  Skull:No acute osseous abnormality.  Orbits: Symmetric proptosis.  There is related herniation of the lacrimal glands.  There is slight to enlargement of the medial recti relative to the lateral recti, raising the possibility of Grave's orbitopathy.  Bilateral cataract resection.  Brain: No evidence of acute abnormality, such as acute infarction, hemorrhage, hydrocephalus, or mass  lesion/mass effect.  IMPRESSION:  1.  No evidence of acute intracranial disease. 2.  Proptosis.   Original Report Authenticated By: Tiburcio Pea   Dg Chest Port 1 View  03/21/2013   *RADIOLOGY REPORT*  Clinical Data: PICC placement.  PORTABLE CHEST - 1 VIEW  Comparison: Single view of the chest 03/19/2013.  Findings: Right PICC is in place the tip projecting over the lower superior vena cava.  There is cardiomegaly without edema.  Lungs are clear.  No pneumothorax or pleural fluid.  AICD noted.  IMPRESSION:  1.  Tip of right PICC projects over the lower superior vena cava. 2.  Cardiomegaly without acute disease.   Original Report Authenticated By: Holley Dexter, M.D.   Medications: I have reviewed the patient's current medications. Scheduled Meds: . albuterol  2.5 mg Nebulization Q6H  . aspirin EC  81 mg Oral Daily  . atorvastatin  80 mg Oral q1800  . carvedilol  25 mg Oral BID WC  . furosemide  40 mg Intravenous BID  . insulin aspart  0-15 Units Subcutaneous TID WC  . insulin aspart  0-5 Units Subcutaneous QHS  . insulin glargine  15 Units Subcutaneous QHS  . ipratropium  0.5 mg Nebulization Q6H  . levETIRAcetam  1,000 mg Oral BID  . lisinopril  5 mg Oral Daily  . pantoprazole  40 mg Oral Daily  . sodium chloride  10-40 mL Intracatheter Q12H   Continuous Infusions: . sodium chloride 10 mL/hr (03/19/13 2016)   PRN Meds:.acetaminophen, albuterol, nicotine, ondansetron (ZOFRAN) IV, ondansetron, sodium chloride, traMADol Assessment/Plan:   # Acute-on-chronic hypercapnic respiratory failure due to OHS/OSA  # Acute encephalopathy, improved  # Obesity hypoventilation syndrome     Patient was noted to have acute encephalopathy 2/2 acute on chronic hypercarbic respiratory failure due to OHS/OSA on 03/20/13. She was transferred to step down and treated with BiPAP. PCCM was consult. Her mental status is improved today even though her ABG looks worse.   - appreciated PCCM input - Monitoring  at the SDU - BiPAP when necessary per PCCM - BD per PCCM - acetazolamide D/C'd >>hopefully her acidosis will improve.  -  O2 goal to keep at 90-94% per PCCM -  PICC line ( no peripheral IV access)  #Chest pain--noncardiac, no further workup indicated by cardiology.  - heparin drip initially started and DC'd - Old records obtained - scanned into epic - t wave inversions on EKG present from previous  01/20/13 , no other cardiac records - Troponin and EKG unremarkable  # AC CHF and ICD  -appreciate cardiology input - Lasix 40 IV twice a day -Continue Coreg and add lisinopril per cardiology recommendation. -Echo done and pending results - strict I & Os  -Daily weight  - BMP and MG BID    # seizure- medical noncompliance.  Reports off medications for at least one month   - Appreciate neurology consult - Head CT negative - continue Keppra - No further neurologic testing indicated per the recommendation  #Suicide Ideation - Pt with history of depression  - Sitter/suicidalprecaution  -Psych consult Dr. Lolly Mustache - Start Abilify 2 mg daily if not medically contraindicated, continue sitter for patient safety. Consultation liaison services will follow up and monitor the progress.   # Hypertension  -losartan and hydralazine (no nitrate, allergy) on home med list, but pt notes non-compliance. BP borderline low.  - on Coreg and lisinopril  #Diabetes mellitus type II    Recent Labs Lab 03/20/13 0628 03/20/13 1130 03/20/13 1702 03/21/13 0747 03/21/13 1206  GLUCAP 119* 128* 105* 82 117*   - Lantus 15 units QHS - SSI  # Homelessness - living with niece-in-law, unable to afford medications  -consult CSW, CM   # Hyperlipidemia - chronic, stable  -start atorvastatin   # Tobacco abuse - encourage cessation  -ordered tobacco abuse counseling  -nicotine patch daily prn   # Prophy - SCD. Will add heparin SQ tonight.   # code: full  Dispo: Disposition is deferred at this time,  awaiting improvement of current medical problems.  Anticipated discharge in approximately 3-4 day(s).   The patient does not have a current PCP (No primary provider on file.) and does need an [facility] hospital follow-up appointment after discharge.  The patient does not have transportation limitations that hinder transportation to clinic appointments.  .Services Needed at time of discharge: Y = Yes, Blank = No PT:   OT:   RN:   Equipment:   Other:     LOS: 2 days   Dede Query, MD 03/21/2013, 5:50 PM

## 2013-03-21 NOTE — Progress Notes (Signed)
Peripherally Inserted Central Catheter/Midline Placement  The IV Nurse has discussed with the patient and/or persons authorized to consent for the patient, the purpose of this procedure and the potential benefits and risks involved with this procedure.  The benefits include less needle sticks, lab draws from the catheter and patient may be discharged home with the catheter.  Risks include, but not limited to, infection, bleeding, blood clot (thrombus formation), and puncture of an artery; nerve damage and irregular heat beat.  Alternatives to this procedure were also discussed.  PICC/Midline Placement Documentation  PICC / Midline Double Lumen 03/21/13 PICC Right Cephalic (Active)  Indication for Insertion or Continuance of Line Poor Vasculature-patient has had multiple peripheral attempts or PIVs lasting less than 24 hours 03/21/2013  2:00 PM  Length mark (cm) 0 cm 03/21/2013  2:00 PM  Site Assessment Clean;Dry;Intact 03/21/2013  2:00 PM  Lumen #1 Status Flushed;Saline locked;Blood return noted 03/21/2013  2:00 PM  Lumen #2 Status Flushed;Saline locked;Blood return noted 03/21/2013  2:00 PM  Dressing Type Transparent 03/21/2013  2:00 PM  Dressing Status Clean;Dry;Intact;Antimicrobial disc in place 03/21/2013  2:00 PM  Dressing Intervention New dressing 03/21/2013  2:00 PM  Dressing Change Due 03/28/13 03/21/2013  2:00 PM       Ethelda Chick 03/21/2013, 2:10 PM

## 2013-03-21 NOTE — Progress Notes (Signed)
  Echocardiogram 2D Echocardiogram has been performed.  Georgian Co 03/21/2013, 4:21 PM

## 2013-03-21 NOTE — Progress Notes (Addendum)
BRIEF PATIENT DESCRIPTION: 67 year old African American female admitted for evaluation of chest pain. Pulmonary consult requested 2 to hypercarbic respiratory failure. Patient has a history of obstructive sleep apnea and obesity hypoventilation syndrome noncompliant with BiPAP.   SIGNIFICANT EVENTS / STUDIES:  8/29:  Transferred to SDU with AMS. CT head negative. ABG results: 7.26/62/76; placed on BiPAP.     Subj: Lethargic. No distress. Answers questions appropriately. + F/C  Obj: Filed Vitals:   03/21/13 1600  BP: 144/51  Pulse: 58  Temp: 99.2 F (37.3 C)  Resp: 24    Gen: Obese, NAD, lethargic HEENT: plethoric, otherwise WNL Neck: short, JVP cannot be visualized Chest: distant BS, no wheezes noted Cardiac: Distant HS, no M noted Abd: obese, soft, NT Ext: symmetric tr edema  BMET    Component Value Date/Time   NA 136 03/21/2013 0650   K 4.5 03/21/2013 0650   CL 101 03/21/2013 0650   CO2 27 03/21/2013 0650   GLUCOSE 81 03/21/2013 0650   BUN 18 03/21/2013 0650   CREATININE 1.06 03/21/2013 0650   CALCIUM 8.7 03/21/2013 0650   GFRNONAA 52* 03/21/2013 0650   GFRAA 61* 03/21/2013 0650    CBC    Component Value Date/Time   WBC 4.6 03/20/2013 2024   RBC 4.05 03/20/2013 2024   HGB 10.7* 03/20/2013 2024   HCT 35.3* 03/20/2013 2024   PLT 163 03/20/2013 2024   MCV 87.2 03/20/2013 2024   MCH 26.4 03/20/2013 2024   MCHC 30.3 03/20/2013 2024   RDW 16.1* 03/20/2013 2024   LYMPHSABS 2.0 03/20/2013 2024   MONOABS 0.7 03/20/2013 2024   EOSABS 0.1 03/20/2013 2024   BASOSABS 0.0 03/20/2013 2024    CXR:  CM, NAD   IMPRESSION: 1) Acute on chronic hypercarbic resp failure due to OHS/OSA 2) Chest pain, nonspecific 3) CHF, compensated 4) AMS due to hypercarbia - improved  PLAN/RECS:  Change BiPAP to PRN Empiric BDs ordered No need for repeated ABGs. Monitor O2 sats, resp work and mental status Agree with DC of acetazolamide Careful O2 titration - these are the people that are often  dependent on hypoxic ventilatory drive  Keep 16-10% Watch in SDU through today   Billy Fischer, MD ; [facility] [phone].  After 5:30 PM or weekends, call [phone]

## 2013-03-21 NOTE — Progress Notes (Signed)
Patient ID: Kelly Phelps, female   DOB: [birth year], 67 y.o.   MRN: [id number]    SUBJECTIVE: Patient mildly sleepy but appropriate to talk to.  She is diuresing reasonably.  Needed Bipap yesterday with hypercarbic respiratory failure, now on nasal cannula.  PaCO2 76 this am with pH 7.20.   Marland Kitchen aspirin EC  81 mg Oral Daily  . atorvastatin  80 mg Oral q1800  . carvedilol  25 mg Oral BID WC  . furosemide  40 mg Intravenous BID  . insulin aspart  0-15 Units Subcutaneous TID WC  . insulin aspart  0-5 Units Subcutaneous QHS  . insulin glargine  25 Units Subcutaneous QHS  . levETIRAcetam  1,000 mg Oral BID  . lisinopril  5 mg Oral Daily  . pantoprazole  40 mg Oral Daily  . potassium chloride SA          Filed Vitals:   03/21/13 0600 03/21/13 0743 03/21/13 0840 03/21/13 0900  BP: 117/64 146/60  139/55  Pulse: 63 57 60 59  Temp:  97.8 F (36.6 C)    TempSrc:  Oral    Resp: 19 19 14 17   Height:      Weight:      SpO2: 99% 98% 99% 98%    Intake/Output Summary (Last 24 hours) at 03/21/13 1050 Last data filed at 03/21/13 0541  Gross per 24 hour  Intake    440 ml  Output   1325 ml  Net   -885 ml    LABS: Basic Metabolic Panel:  Recent Labs  40/98/11 2024 03/21/13 0650  NA 137 136  K 3.8 4.5  CL 101 101  CO2 29 27  GLUCOSE 107* 81  BUN 19 18  CREATININE 1.05 1.06  CALCIUM 9.0 8.7  MG 1.9 1.9   Liver Function Tests:  Recent Labs  03/19/13 1534 03/20/13 2024  AST 16 12  ALT 7 8  ALKPHOS 52 53  BILITOT 0.4 0.3  PROT 6.6 6.4  ALBUMIN 3.2* 3.2*    Recent Labs  03/19/13 1534  LIPASE 48   CBC:  Recent Labs  03/19/13 1534 03/20/13 0510 03/20/13 2024  WBC 4.5 4.9 4.6  NEUTROABS 2.7  --  1.7  HGB 11.0* 11.3* 10.7*  HCT 36.0 36.9 35.3*  MCV 84.7 86.2 87.2  PLT 140* 117* 163   Cardiac Enzymes:  Recent Labs  03/19/13 2236 03/20/13 0510  TROPONINI <0.30 <0.30   BNP: No components found with this basename: POCBNP,  D-Dimer: No results found for this  basename: DDIMER,  in the last 72 hours Hemoglobin A1C:  Recent Labs  03/19/13 2236  HGBA1C 6.8*   Fasting Lipid Panel:  Recent Labs  03/20/13 0510  CHOL 239*  HDL 63  LDLCALC 156*  TRIG 101  CHOLHDL 3.8   Thyroid Function Tests:  Recent Labs  03/20/13 2024  TSH 0.996   Anemia Panel: No results found for this basename: VITAMINB12, FOLATE, FERRITIN, TIBC, IRON, RETICCTPCT,  in the last 72 hours  RADIOLOGY: Dg Chest 2 View  03/19/2013   *RADIOLOGY REPORT*  Clinical Data: Chest pain  CHEST - 2 VIEW  Comparison: None.  Findings: Cardiac shadow is enlarged.  A defibrillator is noted. The lungs are clear.  No acute bony abnormality is noted.  IMPRESSION: No acute abnormalities seen.   Original Report Authenticated By: Alcide Clever, M.D.   Ct Head Wo Contrast  03/20/2013   *RADIOLOGY REPORT*  Clinical Data: Acute encephalopathy.  History of epilepsy.  CT HEAD WITHOUT CONTRAST  Technique:  Contiguous axial images were obtained from the base of the skull through the vertex without contrast.  Comparison: None.  Findings:  Skull:No acute osseous abnormality.  Orbits: Symmetric proptosis.  There is related herniation of the lacrimal glands.  There is slight to enlargement of the medial recti relative to the lateral recti, raising the possibility of Grave's orbitopathy.  Bilateral cataract resection.  Brain: No evidence of acute abnormality, such as acute infarction, hemorrhage, hydrocephalus, or mass lesion/mass effect.  IMPRESSION:  1.  No evidence of acute intracranial disease. 2.  Proptosis.   Original Report Authenticated By: Tiburcio Pea    PHYSICAL EXAM General: NAD, obese Neck: JVP 8-9 cm, thick neck, no thyromegaly or thyroid nodule.  Lungs: Clear to auscultation bilaterally with normal respiratory effort. CV: Nondisplaced PMI.  Heart regular S1/S2, no S3/S4, no murmur.  Trace ankle edema.  No carotid bruit.   Abdomen: Soft, nontender, no hepatosplenomegaly, no distention.    Neurologic: Alert and oriented x 3.  Psych: Normal affect. Extremities: No clubbing or cyanosis.   TELEMETRY: Reviewed telemetry pt in NSR  ASSESSMENT AND PLAN: 67 yo with suspected nonischemic cardiomyopathy and suspected OHS/OSA with acute on chronic CHF (no echo available yet) and acute hypercarbic respiratory failure.  1. CHF: Acute on chronic probably systolic CHF.  No echo available.  She has an ICD.  She still likely has some volume overload.  - Continue Lasix 40 mg IV bid.  - Continue Coreg, will add lisinopril 5 mg daily . - Needs echo.  2. OHS/OSA: Hypercarbic respiratory failure.  PaCo2 remains high but she is mentating well currently. She is off Bipap now.  She remains acidotic by blood gas.  She is on acetazolamide tid which is likely preventing any compensating metabolic alkalosis.  Will stop acetazolamide (HCO3 27).   3. Chest pain: suspect noncardiac.  No further workup at this time.   Marca Ancona 03/21/2013 10:56 AM

## 2013-03-21 NOTE — Progress Notes (Signed)
ABG obtained.  Pt. co2 rising.  Notified Nurse Tim.  Pt talking and following commands. Pressures had been increased to 18/8.  Waiting on Dr.'s orders.

## 2013-03-21 NOTE — Progress Notes (Deleted)
eLink Physician-Brief Progress Note Patient Name: Kelly Phelps DOB: 08-Apr-1946 MRN: [id number]  Date of Service  03/21/2013   HPI/Events of Note  Issues with urinary retention - I/O cath earlier and now patient states she cannot void.  Greater than 500 cc urine via bladder scan.   eICU Interventions  Plan: Insert foley catheter to straight drain      [name] 03/21/2013, 12:06 AM

## 2013-03-21 NOTE — Progress Notes (Signed)
ABG obtained per respiratory.  Ph 7.20; CO2 72.7; PO2 84.  Values noted to be worse than previous ABG despite bipap.  Hemodynamics stable.  Patient is sleepy, but arouses to voice and follows commands appropriately.  No obvious LOC changes noted for previous assessment.  Dr. Claudell Kyle called and notified.  Awaiting new orders.  Will continue to monitor.

## 2013-03-21 NOTE — Progress Notes (Signed)
  Date: 03/21/2013  Patient name: Kelly Phelps  Medical record number: [id number]  Date of birth: December 25, 1945   This patient has been seen and the plan of care was discussed with the house staff. Please see their note for complete details. I concur with their findings with the following additions/corrections: Pt seen and discussed with Dr Dierdre Searles. Clinically, she appears stable. Is somnolent but awakens spontaneously. Oriented. Her numbers (ABG) are worse than her clinical status. CCM is following along with cards and neuro.   1. Acute Resp Failure - Suspect Obesity hypoventilation Syndrome with OSA.  2. Suspected acute on chronic systolic heart failure - Per cards. BB, ACEI, Lasix 3. Epilepsy  Burns Spain, MD 03/21/2013, 1:53 PM

## 2013-03-22 LAB — BASIC METABOLIC PANEL
GFR calc Af Amer: 68 mL/min — ABNORMAL LOW (ref >90)
GFR calc non Af Amer: 58 mL/min — ABNORMAL LOW (ref >90)
Glucose, Bld: 101 mg/dL — ABNORMAL HIGH (ref 70–99)
Potassium: 3.4 mEq/L — ABNORMAL LOW (ref 3.5–5.1)
Sodium: 136 mEq/L (ref 135–145)

## 2013-03-22 LAB — GLUCOSE, CAPILLARY
Glucose-Capillary: 111 mg/dL — ABNORMAL HIGH (ref 70–99)
Glucose-Capillary: 89 mg/dL (ref 70–99)

## 2013-03-22 MED ORDER — ARIPIPRAZOLE 2 MG PO TABS: 2.0000 mg | ORAL_TABLET | Freq: Every day | ORAL | Status: DC

## 2013-03-22 MED ORDER — POTASSIUM CHLORIDE CRYS ER 20 MEQ PO TBCR
40.0000 meq | EXTENDED_RELEASE_TABLET | Freq: Once | ORAL | Status: AC
  Administered 2013-03-22: 40 meq via ORAL
  Filled 2013-03-22: qty 2

## 2013-03-22 NOTE — Evaluation (Signed)
Physical Therapy Evaluation Patient Details Name: Kelly Phelps MRN: [id number] DOB: 01/31/1946 Today's Date: 03/22/2013 Time: 7829-5621 PT Time Calculation (min): 31 min  PT Assessment / Plan / Recommendation History of Present Illness  Patient is a 67 y/o female admitted with SOB, chest tightness and pain with PMH positive for hypertension, hyperlipidemia, type 2 diabetes, and tobacco use (1 pack a week since age 11). She also has history CHF with  AICD in place since Nov of 2013.  Clinical Impression  Patient presents with decreased independence and safety with mobility due to deficits listed below.  Feel she may have functioned with difficulty previously due to unmanaged health issues at home.  Now don't see her able to d/c to niece's home to negotiate stairs.  May improve with acute level PT, currently, however, recommend SNF level rehab at d/c.    PT Assessment  Patient needs continued PT services    Follow Up Recommendations  SNF;Home health PT    Does the patient have the potential to tolerate intense rehabilitation    N/A  Barriers to Discharge Decreased caregiver support;Inaccessible home environment living with neice who works nights, has to negotiate flight of steps to bedrooms and full Ship broker walker with 5" wheels    Recommendations for Other Services   None  Frequency Min 3X/week    Precautions / Restrictions Precautions Precautions: Fall Precaution Comments: Reports 4 falls in 6 months, gets up unaided   Pertinent Vitals/Pain C/o signifcant back pain with ambulation      Mobility  Bed Mobility Bed Mobility: Supine to Sit;Sitting - Scoot to Edge of Bed Supine to Sit: 4: Min assist;HOB elevated;With rails Sitting - Scoot to Edge of Bed: 5: Supervision Details for Bed Mobility Assistance: pulls on walker to scoot to edge of bed, assisted up to sit pt pulling on me with left hand and with rail on right Transfers Transfers: Sit  to Stand;Stand to Sit Sit to Stand: From bed;4: Min guard;With upper extremity assist Stand to Sit: 4: Min guard;To chair/3-in-1;With upper extremity assist Details for Transfer Assistance: pulls up on walker to stand, cues to reach for chair to sit and to back up to seat Ambulation/Gait Ambulation/Gait Assistance: 4: Min guard Ambulation Distance (Feet): 60 Feet Assistive device: Rolling walker Ambulation/Gait Assistance Details: leans to sides to clear feet due to c/o back pain; heavy UE use on walker leaning down on elbows at times due to back pain Gait Pattern: Step-through pattern;Decreased stride length;Decreased hip/knee flexion - right;Decreased hip/knee flexion - left;Lateral trunk lean to right;Lateral trunk lean to left        PT Diagnosis: Difficulty walking;Generalized weakness  PT Problem List: Decreased strength;Decreased mobility;Decreased balance;Decreased activity tolerance;Decreased knowledge of use of DME PT Treatment Interventions: DME instruction;Gait training;Stair training;Balance training;Patient/family education;Functional mobility training;Therapeutic activities;Therapeutic exercise     PT Goals(Current goals can be found in the care plan section) Acute Rehab PT Goals Patient Stated Goal: To get stronger PT Goal Formulation: With patient Time For Goal Achievement: 04/05/13 Potential to Achieve Goals: Good  Visit Information  Last PT Received On: 03/22/13 Assistance Needed: +1 History of Present Illness: Patient is a 67 y/o female admitted with SOB, chest tightness and pain with PMH positive for hypertension, hyperlipidemia, type 2 diabetes, and tobacco use (1 pack a week since age 64). She also has history CHF with  AICD in place since Nov of 2013.       Prior Functioning  Home Living  Family/patient expects to be discharged to:: Private residence Living Arrangements: Other relatives Available Help at Discharge: Available PRN/intermittently Type of Home:  House Home Access: Level entry Home Layout: Two level Alternate Level Stairs-Number of Steps: 2 flights to bedrooms Alternate Level Stairs-Rails: Right Home Equipment: Cane - quad (c-pap) Additional Comments: HHA 2 hours 7 days a week; helps with cooking, light cleaning and helping pt wash up Prior Function Level of Independence: Independent with assistive device(s) Communication Communication: No difficulties Dominant Hand: Right    Cognition  Cognition Arousal/Alertness: Awake/alert Behavior During Therapy: WFL for tasks assessed/performed Overall Cognitive Status: Within Functional Limits for tasks assessed    Extremity/Trunk Assessment Lower Extremity Assessment Lower Extremity Assessment: Overall WFL for tasks assessed Cervical / Trunk Assessment Cervical / Trunk Assessment: Lordotic;Other exceptions Cervical / Trunk Exceptions: c/o lower back pain with ambulation, better at rest   Balance Balance Balance Assessed: Yes Static Standing Balance Static Standing - Balance Support: Bilateral upper extremity supported Static Standing - Level of Assistance: 5: Stand by assistance Rhomberg - Eyes Closed:  (feet apart eyes closed)  End of Session PT - End of Session Equipment Utilized During Treatment: Gait belt Activity Tolerance: Patient limited by fatigue Patient left: in chair;with call bell/phone within reach;with nursing/sitter in room  GP     [facility] 03/22/2013, 11:06 AM Sheran Lawless, PT [phone] 03/22/2013

## 2013-03-22 NOTE — Progress Notes (Signed)
BRIEF PATIENT DESCRIPTION: 67 year old African American female admitted for evaluation of chest pain. Pulmonary consult requested for hypercarbic respiratory failure. Patient has a history of obstructive sleep apnea and obesity hypoventilation syndrome noncompliant with BiPAP.   SIGNIFICANT EVENTS / STUDIES:  8/29:  Transferred to SDU with AMS. CT head negative. ABG results: 7.26/62/76; placed on BiPAP.     Subj: Mildly lethargic. No distress. + F/C  Obj: Filed Vitals:   03/22/13 1200  BP: 112/38  Pulse: 47  Temp:   Resp:     Gen: Obese, NAD, lethargic HEENT: plethoric, otherwise WNL Neck: short, JVP cannot be visualized Chest: distant BS, no wheezes noted Cardiac: Distant HS, no M noted Abd: obese, soft, NT Ext: symmetric tr edema  BMET    Component Value Date/Time   NA 136 03/22/2013 0500   K 3.4* 03/22/2013 0500   CL 101 03/22/2013 0500   CO2 28 03/22/2013 0500   GLUCOSE 101* 03/22/2013 0500   BUN 18 03/22/2013 0500   CREATININE 0.97 03/22/2013 0500   CALCIUM 9.3 03/22/2013 0500   GFRNONAA 58* 03/22/2013 0500   GFRAA 68* 03/22/2013 0500    CBC    Component Value Date/Time   WBC 4.6 03/20/2013 2024   RBC 4.05 03/20/2013 2024   HGB 10.7* 03/20/2013 2024   HCT 35.3* 03/20/2013 2024   PLT 163 03/20/2013 2024   MCV 87.2 03/20/2013 2024   MCH 26.4 03/20/2013 2024   MCHC 30.3 03/20/2013 2024   RDW 16.1* 03/20/2013 2024   LYMPHSABS 2.0 03/20/2013 2024   MONOABS 0.7 03/20/2013 2024   EOSABS 0.1 03/20/2013 2024   BASOSABS 0.0 03/20/2013 2024    CXR: NNF   IMPRESSION: 1) Acute on chronic hypercarbic resp failure due to OHS/OSA 2) Chest pain, nonspecific 3) CHF, compensated 4) AMS due to hypercarbia - improved  PLAN/RECS:  Cont PRN BiPAP if increasingly lethargic Cont empiric BDs as ordered No need for repeated ABGs. Monitor O2 sats, resp work and mental status Caution with loop diuretics as metabolic alkalosis will "allow" for more hypoventilation Careful O2 titration -  Keep 90-94%   Billy Fischer, MD ; [facility] service Mobile [phone].  After 5:30 PM or weekends, call [phone]

## 2013-03-22 NOTE — Progress Notes (Signed)
Subjective: Patient drowsy but arousable.  Using BiPAP at night. States that she is "doing good' today.    Objective: Vital signs in last 24 hours: Filed Vitals:   03/22/13 0500 03/22/13 0800 03/22/13 0814 03/22/13 1115  BP:  105/52  99/58  Pulse:  48    Temp:  98.4 F (36.9 C)    TempSrc:  Oral    Resp:      Height:      Weight: 280 lb 3.3 oz (127.1 kg)     SpO2:  92% 98%    Weight change: -1 lb 8.7 oz (-0.7 kg)  Intake/Output Summary (Last 24 hours) at 03/22/13 1306 Last data filed at 03/22/13 1000  Gross per 24 hour  Intake      0 ml  Output   2350 ml  Net  -2350 ml   General: Drowsy but arousable Neck soft, unable to assess JVDdue to body habitus. Lungs: CTA B/L Heart: RRR, No M/G/R Abdomen: central obesity, soft, nontender, bowel sounds x4 active. Ext: trace edema  Lab Results: Basic Metabolic Panel:  Recent Labs Lab 03/21/13 0650 03/22/13 0500  NA 136 136  K 4.5 3.4*  CL 101 101  CO2 27 28  GLUCOSE 81 101*  BUN 18 18  CREATININE 1.06 0.97  CALCIUM 8.7 9.3  MG 1.9 2.0   Liver Function Tests:  Recent Labs Lab 03/19/13 1534 03/20/13 2024  AST 16 12  ALT 7 8  ALKPHOS 52 53  BILITOT 0.4 0.3  PROT 6.6 6.4  ALBUMIN 3.2* 3.2*    Recent Labs Lab 03/19/13 1534  LIPASE 48   CBC:  Recent Labs Lab 03/19/13 1534 03/20/13 0510 03/20/13 2024  WBC 4.5 4.9 4.6  NEUTROABS 2.7  --  1.7  HGB 11.0* 11.3* 10.7*  HCT 36.0 36.9 35.3*  MCV 84.7 86.2 87.2  PLT 140* 117* 163   Cardiac Enzymes:  Recent Labs Lab 03/19/13 2236 03/20/13 0510  TROPONINI <0.30 <0.30   BNP:  Recent Labs Lab 03/19/13 1535  PROBNP 910.9*   CBG:  Recent Labs Lab 03/21/13 0747 03/21/13 1206 03/21/13 1704 03/21/13 2133 03/22/13 0805 03/22/13 1238  GLUCAP 82 117* 99 184* 89 90   Hemoglobin A1C:  Recent Labs Lab 03/19/13 2236  HGBA1C 6.8*   Fasting Lipid Panel:  Recent Labs Lab 03/20/13 0510  CHOL 239*  HDL 63  LDLCALC 156*  TRIG 101    CHOLHDL 3.8   Thyroid Function Tests:  Recent Labs Lab 03/20/13 2024  TSH 0.996   Coagulation:  Recent Labs Lab 03/19/13 2024 03/19/13 2236  LABPROT 12.8 12.9  INR 0.98 0.99    Drugs of Abuse     Component Value Date/Time   LABOPIA NONE DETECTED 03/20/2013 1628   COCAINSCRNUR NONE DETECTED 03/20/2013 1628   LABBENZ NONE DETECTED 03/20/2013 1628   AMPHETMU NONE DETECTED 03/20/2013 1628   THCU NONE DETECTED 03/20/2013 1628   LABBARB NONE DETECTED 03/20/2013 1628    Urinalysis:  Recent Labs Lab 03/20/13 1100  COLORURINE YELLOW  LABSPEC 1.013  PHURINE 5.5  GLUCOSEU NEGATIVE  HGBUR NEGATIVE  BILIRUBINUR NEGATIVE  KETONESUR NEGATIVE  PROTEINUR NEGATIVE  UROBILINOGEN 1.0  NITRITE NEGATIVE  LEUKOCYTESUR NEGATIVE   Micro Results: Recent Results (from the past 240 hour(s))  MRSA PCR SCREENING     Status: None   Collection Time    03/20/13  6:52 PM      Result Value Range Status   MRSA by PCR NEGATIVE  NEGATIVE Final  Comment:            The GeneXpert MRSA Assay (FDA     approved for NASAL specimens     only), is one component of a     comprehensive MRSA colonization     surveillance program. It is not     intended to diagnose MRSA     infection nor to guide or     monitor treatment for     MRSA infections.   Studies/Results: Ct Head Wo Contrast  03/20/2013   *RADIOLOGY REPORT*  Clinical Data: Acute encephalopathy.  History of epilepsy.  CT HEAD WITHOUT CONTRAST  Technique:  Contiguous axial images were obtained from the base of the skull through the vertex without contrast.  Comparison: None.  Findings:  Skull:No acute osseous abnormality.  Orbits: Symmetric proptosis.  There is related herniation of the lacrimal glands.  There is slight to enlargement of the medial recti relative to the lateral recti, raising the possibility of Grave's orbitopathy.  Bilateral cataract resection.  Brain: No evidence of acute abnormality, such as acute infarction, hemorrhage,  hydrocephalus, or mass lesion/mass effect.  IMPRESSION:  1.  No evidence of acute intracranial disease. 2.  Proptosis.   Original Report Authenticated By: Tiburcio Pea   Dg Chest Port 1 View  03/21/2013   *RADIOLOGY REPORT*  Clinical Data: PICC placement.  PORTABLE CHEST - 1 VIEW  Comparison: Single view of the chest 03/19/2013.  Findings: Right PICC is in place the tip projecting over the lower superior vena cava.  There is cardiomegaly without edema.  Lungs are clear.  No pneumothorax or pleural fluid.  AICD noted.  IMPRESSION:  1.  Tip of right PICC projects over the lower superior vena cava. 2.  Cardiomegaly without acute disease.   Original Report Authenticated By: Holley Dexter, M.D.   Medications: I have reviewed the patient's current medications. Scheduled Meds: . albuterol  2.5 mg Nebulization Q6H  . aspirin EC  81 mg Oral Daily  . atorvastatin  80 mg Oral q1800  . carvedilol  25 mg Oral BID WC  . furosemide  40 mg Intravenous BID  . heparin subcutaneous  5,000 Units Subcutaneous Q8H  . insulin aspart  0-15 Units Subcutaneous TID WC  . insulin aspart  0-5 Units Subcutaneous QHS  . insulin glargine  15 Units Subcutaneous QHS  . ipratropium  0.5 mg Nebulization Q6H  . levETIRAcetam  1,000 mg Oral BID  . lisinopril  5 mg Oral Daily  . pantoprazole  40 mg Oral Daily  . sodium chloride  10-40 mL Intracatheter Q12H   Continuous Infusions: . sodium chloride 10 mL/hr (03/19/13 2016)   PRN Meds:.acetaminophen, albuterol, nicotine, ondansetron (ZOFRAN) IV, ondansetron, sodium chloride, traMADol Assessment/Plan:   # Acute-on-chronic hypercapnic respiratory failure due to OHS/OSA  # Acute encephalopathy, improved  # Obesity hypoventilation syndrome     Patient was noted to have acute encephalopathy 2/2 acute on chronic hypercarbic respiratory failure due to OHS/OSA on 03/20/13. She was transferred to step down and treated with BiPAP. Acidotic,acetazolamide D/C'd 8/30, which was likely  contributing to her acidosis. PCCM was consulted. PICC placed, as pt w/o access. Her mental status has improved. - PCCM following, appreciate recommendations  - Continue SDU for today, if remains stable, transfer out in the am. - BiPAP when necessary per PCCM - O2 goal to keep at 90-94% per PCCM  #Chest pain--noncardiac, no further workup indicated by cardiology. Heparin drip initially started and DC'd. Old records obtained -  scanned into epic - t wave inversions on EKG present from previous 01/20/13, no other cardiac records. Troponin and EKG unremarkable this admission.  # A/C CHF with ICD. 2D ECHO done 8/30 with Mod dialted LV with mild LVH. EF 30% with diffuse hypokinesis. Grade 1 diastolic dysfunction.  - Cardiology following, appreciate input - Lasix 40 IV twice a day - Continue Coreg and lisinopril per cardiology recommendation. - Strict I & Os  - Daily weight  - BMP and Mg daily   # seizure- H/o seizures, supposed to be on Keppra at home but with medical noncompliance.  Reports off medications for at least one month. CT head negative. Neurology consulted, and no further neurologic testing indicated per the recommendation - Neurology following, appreciate recommnedations - Continue Keppra  #Suicide Ideation - Pt with history of depression - Sitter/suicidalprecaution  -Psych consult Dr. Lolly Mustache - Start Abilify 2 mg daily if not medically contraindicated, continue sitter for patient safety. Consultation liaison services will follow up and monitor the progress.   # Hypertension -losartan and hydralazine (no nitrate, allergy) on home med list, but pt notes non-compliance. BP borderline low.  - on Coreg and lisinopril  #Diabetes mellitus type II   Recent Labs Lab 03/21/13 1206 03/21/13 1704 03/21/13 2133 03/22/13 0805 03/22/13 1238  GLUCAP 117* 99 184* 89 90   - Lantus 15 units QHS - SSI  # Homelessness - living with niece-in-law, unable to afford medications  -consult CSW,  CM   # Hyperlipidemia - chronic, stable  -start atorvastatin   # Tobacco abuse - encourage cessation  -ordered tobacco abuse counseling  -nicotine patch daily prn   # DVT PPx - Heparin [place] and SCDs.   # Code status: Full   Dispo: Disposition is deferred at this time, awaiting improvement of current medical problems.  Anticipated discharge in approximately 1-3 day(s).   The patient does not have a current PCP (No primary provider on file.) and does need an [facility] hospital follow-up appointment after discharge.  The patient does not have transportation limitations that hinder transportation to clinic appointments.  .Services Needed at time of discharge: Y = Yes, Blank = No PT:   Home health vs SNF  OT:   RN:   Equipment:  Rolling walker with 5" wheels  Other:     LOS: 3 days   Genelle Gather, MD 03/22/2013, 1:06 PM

## 2013-03-22 NOTE — Progress Notes (Signed)
Patient ID: Kelly Phelps, female   DOB: 16-Aug-1945, 67 y.o.   MRN: [id number]   SUBJECTIVE: Patient mildly sleepy but appropriate to talk to.  Says that her breathing is getting better.   Marland Kitchen albuterol  2.5 mg Nebulization Q6H  . aspirin EC  81 mg Oral Daily  . atorvastatin  80 mg Oral q1800  . carvedilol  25 mg Oral BID WC  . furosemide  40 mg Intravenous BID  . heparin subcutaneous  5,000 Units Subcutaneous Q8H  . insulin aspart  0-15 Units Subcutaneous TID WC  . insulin aspart  0-5 Units Subcutaneous QHS  . insulin glargine  15 Units Subcutaneous QHS  . ipratropium  0.5 mg Nebulization Q6H  . levETIRAcetam  1,000 mg Oral BID  . lisinopril  5 mg Oral Daily  . pantoprazole  40 mg Oral Daily  . potassium chloride  40 mEq Oral Once  . sodium chloride  10-40 mL Intracatheter Q12H      Filed Vitals:   03/22/13 0400 03/22/13 0500 03/22/13 0800 03/22/13 0814  BP: 123/89  105/52   Pulse: 55  48   Temp: 98.1 F (36.7 C)  98.4 F (36.9 C)   TempSrc: Oral  Oral   Resp:      Height:      Weight:  127.1 kg (280 lb 3.3 oz)    SpO2: 91%  92% 98%    Intake/Output Summary (Last 24 hours) at 03/22/13 0948 Last data filed at 03/22/13 0900  Gross per 24 hour  Intake      0 ml  Output   1750 ml  Net  -1750 ml    LABS: Basic Metabolic Panel:  Recent Labs  40/98/11 0650 03/22/13 0500  NA 136 136  K 4.5 3.4*  CL 101 101  CO2 27 28  GLUCOSE 81 101*  BUN 18 18  CREATININE 1.06 0.97  CALCIUM 8.7 9.3  MG 1.9 2.0   Liver Function Tests:  Recent Labs  03/19/13 1534 03/20/13 2024  AST 16 12  ALT 7 8  ALKPHOS 52 53  BILITOT 0.4 0.3  PROT 6.6 6.4  ALBUMIN 3.2* 3.2*    Recent Labs  03/19/13 1534  LIPASE 48   CBC:  Recent Labs  03/19/13 1534 03/20/13 0510 03/20/13 2024  WBC 4.5 4.9 4.6  NEUTROABS 2.7  --  1.7  HGB 11.0* 11.3* 10.7*  HCT 36.0 36.9 35.3*  MCV 84.7 86.2 87.2  PLT 140* 117* 163   Cardiac Enzymes:  Recent Labs  03/19/13 2236 03/20/13 0510    TROPONINI <0.30 <0.30   BNP: No components found with this basename: POCBNP,  D-Dimer: No results found for this basename: DDIMER,  in the last 72 hours Hemoglobin A1C:  Recent Labs  03/19/13 2236  HGBA1C 6.8*   Fasting Lipid Panel:  Recent Labs  03/20/13 0510  CHOL 239*  HDL 63  LDLCALC 156*  TRIG 101  CHOLHDL 3.8   Thyroid Function Tests:  Recent Labs  03/20/13 2024  TSH 0.996   Anemia Panel: No results found for this basename: VITAMINB12, FOLATE, FERRITIN, TIBC, IRON, RETICCTPCT,  in the last 72 hours  RADIOLOGY: Dg Chest 2 View  03/19/2013   *RADIOLOGY REPORT*  Clinical Data: Chest pain  CHEST - 2 VIEW  Comparison: None.  Findings: Cardiac shadow is enlarged.  A defibrillator is noted. The lungs are clear.  No acute bony abnormality is noted.  IMPRESSION: No acute abnormalities seen.   Original Report Authenticated  By: Alcide Clever, M.D.   Ct Head Wo Contrast  03/20/2013   *RADIOLOGY REPORT*  Clinical Data: Acute encephalopathy.  History of epilepsy.  CT HEAD WITHOUT CONTRAST  Technique:  Contiguous axial images were obtained from the base of the skull through the vertex without contrast.  Comparison: None.  Findings:  Skull:No acute osseous abnormality.  Orbits: Symmetric proptosis.  There is related herniation of the lacrimal glands.  There is slight to enlargement of the medial recti relative to the lateral recti, raising the possibility of Grave's orbitopathy.  Bilateral cataract resection.  Brain: No evidence of acute abnormality, such as acute infarction, hemorrhage, hydrocephalus, or mass lesion/mass effect.  IMPRESSION:  1.  No evidence of acute intracranial disease. 2.  Proptosis.   Original Report Authenticated By: Tiburcio Pea    PHYSICAL EXAM General: NAD, obese Neck: JVP difficult, thick neck, no thyromegaly or thyroid nodule.  Lungs: Clear to auscultation bilaterally with normal respiratory effort. CV: Nondisplaced PMI.  Heart regular S1/S2, no  S3/S4, no murmur.  Trace ankle edema.  No carotid bruit.   Abdomen: Soft, nontender, no hepatosplenomegaly, no distention.  Neurologic: Alert and oriented x 3.  Psych: Normal affect. Extremities: No clubbing or cyanosis.   TELEMETRY: Reviewed telemetry pt in NSR  ASSESSMENT AND PLAN: 67 yo with suspected nonischemic cardiomyopathy and suspected OHS/OSA with acute on chronic systolic CHF and acute hypercarbic respiratory failure.  1. CHF: Acute on chronic systolic CHF.  EF 30%, global hypokinesis on echo.  Suspect nonischemic cardiomyopathy.  She has an ICD.  Volume difficult to tell.  I/Os incomplete, weight does not seem to have changed much.  - Continue Lasix 40 mg IV bid today, but will transduce CVP on PICC and if significantly elevated, increase Lasix.  - Continue Coreg and lisinopril.  2. OHS/OSA: Hypercarbic respiratory failure, per CCM.   3. Chest pain: suspect noncardiac.  No further workup at this time.   Marca Ancona 03/22/2013 9:48 AM

## 2013-03-22 NOTE — Progress Notes (Signed)
CSW met with patient to attempt to assess for snf. However, patient very sleepy and defers to patient's niece. Patient still has sitter at bedside. Upon review, Psych MD has not made a disposition recommendation. Patient's nurse states that patient was weepy earlier in the day. CSW discussed with charge nurse and stated psych needs to come back and make disposition recommendation.  Jamesyn Lindell C. Parul Porcelli MSW, LCSW [phone]

## 2013-03-22 NOTE — Progress Notes (Signed)
  Date: 03/22/2013  Patient name: Kelly Phelps  Medical record number: [id number]  Date of birth: [birth year]   This patient has been seen and the plan of care was discussed with the house staff. Please see their note for complete details. I concur with their findings with the following additions/corrections: She is more alert today. Today breathing is back at baseline - baseline is poor. Uses motorized WC at [employer], can walk around house but stops freq. PT is rec SNF or home PT.   Burns Spain, MD 03/22/2013, 11:21 AM

## 2013-03-23 DIAGNOSIS — F329 Major depressive disorder, single episode, unspecified: Secondary | ICD-10-CM

## 2013-03-23 LAB — URINALYSIS, ROUTINE W REFLEX MICROSCOPIC
Glucose, UA: NEGATIVE mg/dL
Leukocytes, UA: NEGATIVE
Specific Gravity, Urine: 1.016 (ref 1.005–1.030)
pH: 6 (ref 5.0–8.0)

## 2013-03-23 LAB — BASIC METABOLIC PANEL
BUN: 24 mg/dL — ABNORMAL HIGH (ref 6–23)
CO2: 28 mEq/L (ref 19–32)
Chloride: 102 mEq/L (ref 96–112)
Glucose, Bld: 115 mg/dL — ABNORMAL HIGH (ref 70–99)
Potassium: 3.5 mEq/L (ref 3.5–5.1)

## 2013-03-23 LAB — GLUCOSE, CAPILLARY
Glucose-Capillary: 95 mg/dL (ref 70–99)
Glucose-Capillary: 140 mg/dL — ABNORMAL HIGH (ref 70–99)

## 2013-03-23 MED ORDER — FUROSEMIDE 40 MG PO TABS
40.0000 mg | ORAL_TABLET | Freq: Two times a day (BID) | ORAL | Status: DC
  Administered 2013-03-23 – 2013-03-25 (×4): 40 mg via ORAL
  Filled 2013-03-23 (×7): qty 1

## 2013-03-23 NOTE — Consult Note (Signed)
Kelly [id number]-[id number]  Subjective Patient seen and chart reviewed.  Patient is doing much better.  She is a 67 year old who has multiple medical problems.  She was seen in consultation liaison services 2 days ago after she was experiencing depression and having suicidal thoughts.  Patient told that she is doing much better.  She was not taking Abilify because of increase QTC.  Patient also does not want any psychotropic medication.  She has tried multiple medications including Haldol, Prozac and Risperdal with limited response.  She denies any suicidal thoughts.  She wants to get counseling and therapy .  She likes to get outpatient  Counseling.  At this time patient does not have any suicidal thoughts or homicidal thoughts.  She is pleasant cooperative and has no aggression or agitation she admitted to some crying spells but she believed the cost of living situation.  She admitted that the medication will change her living situation.  Patient does not want any psychotropic medication because of fear of side effects and lack of response.  She denies any paranoia, hallucination or any active or passive suicidal thought.   Past Psychiatric History: Patient has history of psychiatric admission treatment in [place].  She admitted history of suicidal attempt by taking Overdose on the medication many years ago.  She has tried multiple medication with lack of response   Past Medical History  Diagnosis Date  . Atrial fibrillation     Pt denies history of this, unclear why this is on patient's problem list  . Hypertension   . Diabetes mellitus without complication   . CAD (coronary artery disease)     History of 2 prior MI's, the most recent of which was in 2013  . Hyperlipemia   . GERD (gastroesophageal reflux disease)   . DVT (deep venous thrombosis)     History of 1 DVT around 2012, unclear if provoked or unprovoked. Was previously on Coumadin, but this was stopped due to a GI bleed  .  Congestive heart failure     With ICD in place since 05/2010  . Epilepsy   . Obstructive sleep apnea     reports that she has been smoking.  She does not have any smokeless tobacco history on file. She reports that she does not drink alcohol or use illicit drugs. Family History  Problem Relation Age of Onset  . Heart attack Daughter 28  . Heart attack Sister 65  . Heart attack Mother 46  . Renal Disease Sister     ESRD, on HD  . Heart failure Daughter      Living Arrangements: Other relatives, lives with her niece.  She is single and currently on disability.   Abuse/Neglect [facility]) Physical Abuse: Denies Verbal Abuse: Denies Sexual Abuse: Denies Allergies:   Allergies  Allergen Reactions  . Hydrogen Peroxide Rash  . Iodine Rash  . Nitroglycerin Other (See Comments)    Headache only  . Penicillins Anxiety    "makes me paranoid, like I'm on marijuana"  . Phenobarbital Rash    Blood pressure 126/61, pulse 73, temperature 98.2 F (36.8 C), temperature source Oral, resp. rate 16, height 5\' 2"  (1.575 m), weight 272 lb 7.8 oz (123.6 kg), SpO2 90.00%.Body mass index is 49.83 kg/(m^2). Results for orders placed during the hospital encounter of 03/19/13 (from the past 72 hour(s))  HEPARIN LEVEL (UNFRACTIONATED)     Status: None   Collection Time    03/20/13  2:57 PM      Result  Value Range   Heparin Unfractionated 0.61  0.30 - 0.70 IU/mL   Comment:            IF HEPARIN RESULTS ARE BELOW     EXPECTED VALUES, AND PATIENT     DOSAGE HAS BEEN CONFIRMED,     SUGGEST FOLLOW UP TESTING     OF ANTITHROMBIN III LEVELS.  BLOOD GAS, ARTERIAL     Status: Abnormal   Collection Time    03/20/13  4:00 PM      Result Value Range   O2 Content 2.0     Delivery systems NASAL CANNULA     pH, Arterial 7.267 (*) 7.350 - 7.450   pCO2 arterial 62.4 (*) 35.0 - 45.0 mmHg   Comment: CRITICAL RESULT CALLED TO, READ BACK BY AND VERIFIED WITH:     LI NE MD AT 1750 BY EMILY TROGDON RRT,RCP ON 03/20/13    pO2, Arterial 76.6 (*) 80.0 - 100.0 mmHg   Bicarbonate 27.6 (*) 20.0 - 24.0 mEq/L   TCO2 29.5  0 - 100 mmol/L   Acid-Base Excess 1.3  0.0 - 2.0 mmol/L   O2 Saturation 96.1     Patient temperature 98.2     Collection site LEFT RADIAL     Drawn by [id number]     Sample type ARTERIAL     Allens test (pass/fail) PASS  PASS  URINE RAPID DRUG SCREEN (HOSP PERFORMED)     Status: None   Collection Time    03/20/13  4:28 PM      Result Value Range   Opiates NONE DETECTED  NONE DETECTED   Cocaine NONE DETECTED  NONE DETECTED   Benzodiazepines NONE DETECTED  NONE DETECTED   Amphetamines NONE DETECTED  NONE DETECTED   Tetrahydrocannabinol NONE DETECTED  NONE DETECTED   Barbiturates NONE DETECTED  NONE DETECTED   Comment:            DRUG SCREEN FOR MEDICAL PURPOSES     ONLY.  IF CONFIRMATION IS NEEDED     FOR ANY PURPOSE, NOTIFY LAB     WITHIN 5 DAYS.                LOWEST DETECTABLE LIMITS     FOR URINE DRUG SCREEN     Drug Class       Cutoff (ng/mL)     Amphetamine      1000     Barbiturate      200     Benzodiazepine   200     Tricyclics       300     Opiates          300     Cocaine          300     THC              50  GLUCOSE, CAPILLARY     Status: Abnormal   Collection Time    03/20/13  5:02 PM      Result Value Range   Glucose-Capillary 105 (*) 70 - 99 mg/dL   Comment 1 Notify RN    BASIC METABOLIC PANEL     Status: Abnormal   Collection Time    03/20/13  6:18 PM      Result Value Range   Sodium 137  135 - 145 mEq/L   Potassium 3.9  3.5 - 5.1 mEq/L   Chloride 101  96 - 112 mEq/L   CO2 30  19 -  32 mEq/L   Glucose, Bld 106 (*) 70 - 99 mg/dL   BUN 18  6 - 23 mg/dL   Creatinine, Ser 6.57  0.50 - 1.10 mg/dL   Calcium 9.0  8.4 - 84.6 mg/dL   GFR calc non Af Amer 52 (*) >90 mL/min   GFR calc Af Amer 61 (*) >90 mL/min   Comment: (NOTE)     The eGFR has been calculated using the CKD EPI equation.     This calculation has not been validated in all clinical situations.      eGFR's persistently <90 mL/min signify possible Chronic Kidney     Disease.  MAGNESIUM     Status: None   Collection Time    03/20/13  6:18 PM      Result Value Range   Magnesium 1.8  1.5 - 2.5 mg/dL  MRSA PCR SCREENING     Status: None   Collection Time    03/20/13  6:52 PM      Result Value Range   MRSA by PCR NEGATIVE  NEGATIVE   Comment:            The GeneXpert MRSA Assay (FDA     approved for NASAL specimens     only), is one component of a     comprehensive MRSA colonization     surveillance program. It is not     intended to diagnose MRSA     infection nor to guide or     monitor treatment for     MRSA infections.  TSH     Status: None   Collection Time    03/20/13  8:24 PM      Result Value Range   TSH 0.996  0.350 - 4.500 uIU/mL   Comment: Performed at [facility]  CBC WITH DIFFERENTIAL     Status: Abnormal   Collection Time    03/20/13  8:24 PM      Result Value Range   WBC 4.6  4.0 - 10.5 K/uL   RBC 4.05  3.87 - 5.11 MIL/uL   Hemoglobin 10.7 (*) 12.0 - 15.0 g/dL   HCT 96.2 (*) 95.2 - 84.1 %   MCV 87.2  78.0 - 100.0 fL   MCH 26.4  26.0 - 34.0 pg   MCHC 30.3  30.0 - 36.0 g/dL   RDW 32.4 (*) 40.1 - 02.7 %   Platelets 163  150 - 400 K/uL   Comment: DELTA CHECK NOTED     REPEATED TO VERIFY   Neutrophils Relative % 37 (*) 43 - 77 %   Neutro Abs 1.7  1.7 - 7.7 K/uL   Lymphocytes Relative 44  12 - 46 %   Lymphs Abs 2.0  0.7 - 4.0 K/uL   Monocytes Relative 16 (*) 3 - 12 %   Monocytes Absolute 0.7  0.1 - 1.0 K/uL   Eosinophils Relative 3  0 - 5 %   Eosinophils Absolute 0.1  0.0 - 0.7 K/uL   Basophils Relative 0  0 - 1 %   Basophils Absolute 0.0  0.0 - 0.1 K/uL  COMPREHENSIVE METABOLIC PANEL     Status: Abnormal   Collection Time    03/20/13  8:24 PM      Result Value Range   Sodium 137  135 - 145 mEq/L   Potassium 3.8  3.5 - 5.1 mEq/L   Chloride 101  96 - 112 mEq/L   CO2 29  19 - 32 mEq/L  Glucose, Bld 107 (*) 70 - 99 mg/dL   BUN 19  6 - 23  mg/dL   Creatinine, Ser 1.61  0.50 - 1.10 mg/dL   Calcium 9.0  8.4 - 09.6 mg/dL   Total Protein 6.4  6.0 - 8.3 g/dL   Albumin 3.2 (*) 3.5 - 5.2 g/dL   AST 12  0 - 37 U/L   ALT 8  0 - 35 U/L   Alkaline Phosphatase 53  39 - 117 U/L   Total Bilirubin 0.3  0.3 - 1.2 mg/dL   GFR calc non Af Amer 53 (*) >90 mL/min   GFR calc Af Amer 61 (*) >90 mL/min   Comment: (NOTE)     The eGFR has been calculated using the CKD EPI equation.     This calculation has not been validated in all clinical situations.     eGFR's persistently <90 mL/min signify possible Chronic Kidney     Disease.  MAGNESIUM     Status: None   Collection Time    03/20/13  8:24 PM      Result Value Range   Magnesium 1.9  1.5 - 2.5 mg/dL  GLUCOSE, CAPILLARY     Status: Abnormal   Collection Time    03/20/13  9:58 PM      Result Value Range   Glucose-Capillary 111 (*) 70 - 99 mg/dL  POCT I-STAT 3, BLOOD GAS (G3+)     Status: Abnormal   Collection Time    03/21/13  4:23 AM      Result Value Range   pH, Arterial 7.208 (*) 7.350 - 7.450   pCO2 arterial 72.7 (*) 35.0 - 45.0 mmHg   pO2, Arterial 84.0  80.0 - 100.0 mmHg   Bicarbonate 28.9 (*) 20.0 - 24.0 mEq/L   TCO2 31  0 - 100 mmol/L   O2 Saturation 93.0     Acid-base deficit 1.0  0.0 - 2.0 mmol/L   Patient temperature 98.6 F     Collection site RADIAL, ALLEN'S TEST ACCEPTABLE     Drawn by Operator     Sample type ARTERIAL     Comment NOTIFIED PHYSICIAN    GLUCOSE, CAPILLARY     Status: None   Collection Time    03/21/13  4:56 AM      Result Value Range   Glucose-Capillary 93  70 - 99 mg/dL  BASIC METABOLIC PANEL     Status: Abnormal   Collection Time    03/21/13  6:50 AM      Result Value Range   Sodium 136  135 - 145 mEq/L   Potassium 4.5  3.5 - 5.1 mEq/L   Chloride 101  96 - 112 mEq/L   CO2 27  19 - 32 mEq/L   Glucose, Bld 81  70 - 99 mg/dL   BUN 18  6 - 23 mg/dL   Creatinine, Ser 0.45  0.50 - 1.10 mg/dL   Calcium 8.7  8.4 - 40.9 mg/dL   GFR calc non Af  Amer 52 (*) >90 mL/min   GFR calc Af Amer 61 (*) >90 mL/min   Comment: (NOTE)     The eGFR has been calculated using the CKD EPI equation.     This calculation has not been validated in all clinical situations.     eGFR's persistently <90 mL/min signify possible Chronic Kidney     Disease.  MAGNESIUM     Status: None   Collection Time    03/21/13  6:50 AM      Result Value Range   Magnesium 1.9  1.5 - 2.5 mg/dL  GLUCOSE, CAPILLARY     Status: None   Collection Time    03/21/13  7:47 AM      Result Value Range   Glucose-Capillary 82  70 - 99 mg/dL  POCT I-STAT 3, BLOOD GAS (G3+)     Status: Abnormal   Collection Time    03/21/13  8:41 AM      Result Value Range   pH, Arterial 7.204 (*) 7.350 - 7.450   pCO2 arterial 76.9 (*) 35.0 - 45.0 mmHg   pO2, Arterial 130.0 (*) 80.0 - 100.0 mmHg   Bicarbonate 30.3 (*) 20.0 - 24.0 mEq/L   TCO2 33  0 - 100 mmol/L   O2 Saturation 98.0     Collection site RADIAL, ALLEN'S TEST ACCEPTABLE     Drawn by RT     Sample type ARTERIAL     Comment NOTIFIED PHYSICIAN    GLUCOSE, CAPILLARY     Status: Abnormal   Collection Time    03/21/13 12:06 PM      Result Value Range   Glucose-Capillary 117 (*) 70 - 99 mg/dL  GLUCOSE, CAPILLARY     Status: None   Collection Time    03/21/13  5:04 PM      Result Value Range   Glucose-Capillary 99  70 - 99 mg/dL  GLUCOSE, CAPILLARY     Status: Abnormal   Collection Time    03/21/13  9:33 PM      Result Value Range   Glucose-Capillary 184 (*) 70 - 99 mg/dL  BASIC METABOLIC PANEL     Status: Abnormal   Collection Time    03/22/13  5:00 AM      Result Value Range   Sodium 136  135 - 145 mEq/L   Potassium 3.4 (*) 3.5 - 5.1 mEq/L   Chloride 101  96 - 112 mEq/L   CO2 28  19 - 32 mEq/L   Glucose, Bld 101 (*) 70 - 99 mg/dL   BUN 18  6 - 23 mg/dL   Creatinine, Ser 5.62  0.50 - 1.10 mg/dL   Calcium 9.3  8.4 - 13.0 mg/dL   GFR calc non Af Amer 58 (*) >90 mL/min   GFR calc Af Amer 68 (*) >90 mL/min   Comment:  (NOTE)     The eGFR has been calculated using the CKD EPI equation.     This calculation has not been validated in all clinical situations.     eGFR's persistently <90 mL/min signify possible Chronic Kidney     Disease.  MAGNESIUM     Status: None   Collection Time    03/22/13  5:00 AM      Result Value Range   Magnesium 2.0  1.5 - 2.5 mg/dL  GLUCOSE, CAPILLARY     Status: None   Collection Time    03/22/13  8:05 AM      Result Value Range   Glucose-Capillary 89  70 - 99 mg/dL  GLUCOSE, CAPILLARY     Status: None   Collection Time    03/22/13 12:38 PM      Result Value Range   Glucose-Capillary 90  70 - 99 mg/dL  GLUCOSE, CAPILLARY     Status: Abnormal   Collection Time    03/22/13  4:38 PM      Result Value Range   Glucose-Capillary 100 (*) 70 -  99 mg/dL  GLUCOSE, CAPILLARY     Status: Abnormal   Collection Time    03/22/13  9:23 PM      Result Value Range   Glucose-Capillary 128 (*) 70 - 99 mg/dL  BASIC METABOLIC PANEL     Status: Abnormal   Collection Time    03/23/13  5:00 AM      Result Value Range   Sodium 139  135 - 145 mEq/L   Potassium 3.5  3.5 - 5.1 mEq/L   Chloride 102  96 - 112 mEq/L   CO2 28  19 - 32 mEq/L   Glucose, Bld 115 (*) 70 - 99 mg/dL   BUN 24 (*) 6 - 23 mg/dL   Creatinine, Ser 9.56  0.50 - 1.10 mg/dL   Calcium 9.8  8.4 - 21.3 mg/dL   GFR calc non Af Amer 52 (*) >90 mL/min   GFR calc Af Amer 61 (*) >90 mL/min   Comment: (NOTE)     The eGFR has been calculated using the CKD EPI equation.     This calculation has not been validated in all clinical situations.     eGFR's persistently <90 mL/min signify possible Chronic Kidney     Disease.  MAGNESIUM     Status: None   Collection Time    03/23/13  5:00 AM      Result Value Range   Magnesium 1.9  1.5 - 2.5 mg/dL  GLUCOSE, CAPILLARY     Status: None   Collection Time    03/23/13  7:25 AM      Result Value Range   Glucose-Capillary 95  70 - 99 mg/dL  URINALYSIS, ROUTINE W REFLEX MICROSCOPIC      Status: None   Collection Time    03/23/13  9:45 AM      Result Value Range   Color, Urine YELLOW  YELLOW   APPearance CLEAR  CLEAR   Specific Gravity, Urine 1.016  1.005 - 1.030   pH 6.0  5.0 - 8.0   Glucose, UA NEGATIVE  NEGATIVE mg/dL   Hgb urine dipstick NEGATIVE  NEGATIVE   Bilirubin Urine NEGATIVE  NEGATIVE   Ketones, ur NEGATIVE  NEGATIVE mg/dL   Protein, ur NEGATIVE  NEGATIVE mg/dL   Urobilinogen, UA 1.0  0.0 - 1.0 mg/dL   Nitrite NEGATIVE  NEGATIVE   Leukocytes, UA NEGATIVE  NEGATIVE   Comment: MICROSCOPIC NOT DONE ON URINES WITH NEGATIVE PROTEIN, BLOOD, LEUKOCYTES, NITRITE, OR GLUCOSE <1000 mg/dL.  GLUCOSE, CAPILLARY     Status: Abnormal   Collection Time    03/23/13 11:32 AM      Result Value Range   Glucose-Capillary 121 (*) 70 - 99 mg/dL   Labs are reviewed and are pertinent for metabolic abnormalities.  Current Facility-Administered Medications  Medication Dose Route Frequency Provider Last Rate Last Dose  . 0.9 %  sodium chloride infusion   Intravenous Continuous Toy Baker, MD 10 mL/hr at 03/19/13 2016 10 mL/hr at 03/19/13 2016  . acetaminophen (TYLENOL) tablet 650 mg  650 mg Oral Q4H PRN Linward Headland, MD   650 mg at 03/21/13 0865  . albuterol (PROVENTIL) (5 MG/ML) 0.5% nebulizer solution 2.5 mg  2.5 mg Nebulization Q6H Merwyn Katos, MD   2.5 mg at 03/23/13 0826  . albuterol (PROVENTIL) (5 MG/ML) 0.5% nebulizer solution 2.5 mg  2.5 mg Nebulization Q3H PRN Merwyn Katos, MD      . aspirin EC tablet 81 mg  81  mg Oral Daily Linward Headland, MD   81 mg at 03/23/13 0946  . atorvastatin (LIPITOR) tablet 80 mg  80 mg Oral q1800 Linward Headland, MD   80 mg at 03/22/13 1745  . carvedilol (COREG) tablet 25 mg  25 mg Oral BID WC Linward Headland, MD   25 mg at 03/23/13 0946  . furosemide (LASIX) tablet 40 mg  40 mg Oral BID Lyn Records III, MD      . heparin injection 5,000 Units  5,000 Units Subcutaneous Q8H Dede Query, MD   5,000 Units at 03/23/13 1337  . insulin aspart  (novoLOG) injection 0-15 Units  0-15 Units Subcutaneous TID WC Linward Headland, MD   2 Units at 03/23/13 1337  . insulin aspart (novoLOG) injection 0-5 Units  0-5 Units Subcutaneous QHS Linward Headland, MD      . insulin glargine (LANTUS) injection 15 Units  15 Units Subcutaneous QHS Merwyn Katos, MD   15 Units at 03/22/13 2151  . ipratropium (ATROVENT) nebulizer solution 0.5 mg  0.5 mg Nebulization Q6H Merwyn Katos, MD   0.5 mg at 03/23/13 0826  . levETIRAcetam (KEPPRA) tablet 1,000 mg  1,000 mg Oral BID Linward Headland, MD   1,000 mg at 03/23/13 0946  . lisinopril (PRINIVIL,ZESTRIL) tablet 5 mg  5 mg Oral Daily Laurey Morale, MD   5 mg at 03/23/13 0946  . nicotine (NICODERM CQ - dosed in mg/24 hours) patch 14 mg  14 mg Transdermal Daily PRN Linward Headland, MD      . ondansetron [facility]) injection 4 mg  4 mg Intravenous Q6H PRN Linward Headland, MD      . ondansetron (ZOFRAN-ODT) disintegrating tablet 4 mg  4 mg Oral Q8H PRN Na Li, MD      . pantoprazole (PROTONIX) EC tablet 40 mg  40 mg Oral Daily Linward Headland, MD   40 mg at 03/23/13 0946  . sodium chloride 0.9 % injection 10-40 mL  10-40 mL Intracatheter Q12H Inez Catalina, MD   10 mL at 03/23/13 1000  . sodium chloride 0.9 % injection 10-40 mL  10-40 mL Intracatheter PRN Inez Catalina, MD   20 mL at 03/22/13 1127  . traMADol (ULTRAM) tablet 50 mg  50 mg Oral Q6H PRN Na Li, MD   50 mg at 03/21/13 1655    Mental Status Examination Patient is a morbidly obese female who is sitting comfortably patient is pleasant and cooperative.  She maintained fair eye contact her speech is clear and slow.  patient denies any active or passive suicidal thoughts or homicidal thoughts.  She denies any auditory or visual hallucination.  Her thought process is slow but logical linear and goal-directed.  There are no tremors or shakes.  She's alert and oriented x3.  Her insight judgment and impulse control is okay.    Current Medication Current facility-administered  medications:0.9 %  sodium chloride infusion, , Intravenous, Continuous, Toy Baker, MD, Last Rate: 10 mL/hr at 03/19/13 2016, 10 mL/hr at 03/19/13 2016;  acetaminophen (TYLENOL) tablet 650 mg, 650 mg, Oral, Q4H PRN, Linward Headland, MD, 650 mg at 03/21/13 0903;  albuterol (PROVENTIL) (5 MG/ML) 0.5% nebulizer solution 2.5 mg, 2.5 mg, Nebulization, Q6H, Merwyn Katos, MD, 2.5 mg at 03/23/13 0826 albuterol (PROVENTIL) (5 MG/ML) 0.5% nebulizer solution 2.5 mg, 2.5 mg, Nebulization, Q3H PRN, Merwyn Katos, MD;  aspirin EC tablet 81 mg, 81 mg, Oral, Daily,  Linward Headland, MD, 81 mg at 03/23/13 0946;  atorvastatin (LIPITOR) tablet 80 mg, 80 mg, Oral, q1800, Linward Headland, MD, 80 mg at 03/22/13 1745;  carvedilol (COREG) tablet 25 mg, 25 mg, Oral, BID WC, Linward Headland, MD, 25 mg at 03/23/13 0946 furosemide (LASIX) tablet 40 mg, 40 mg, Oral, BID, Lyn Records III, MD;  heparin injection 5,000 Units, 5,000 Units, Subcutaneous, Q8H, Dede Query, MD, 5,000 Units at 03/23/13 1337;  insulin aspart (novoLOG) injection 0-15 Units, 0-15 Units, Subcutaneous, TID WC, Linward Headland, MD, 2 Units at 03/23/13 1337;  insulin aspart (novoLOG) injection 0-5 Units, 0-5 Units, Subcutaneous, QHS, Linward Headland, MD insulin glargine (LANTUS) injection 15 Units, 15 Units, Subcutaneous, QHS, Merwyn Katos, MD, 15 Units at 03/22/13 2151;  ipratropium (ATROVENT) nebulizer solution 0.5 mg, 0.5 mg, Nebulization, Q6H, Merwyn Katos, MD, 0.5 mg at 03/23/13 6045;  levETIRAcetam (KEPPRA) tablet 1,000 mg, 1,000 mg, Oral, BID, Linward Headland, MD, 1,000 mg at 03/23/13 0946 lisinopril (PRINIVIL,ZESTRIL) tablet 5 mg, 5 mg, Oral, Daily, Laurey Morale, MD, 5 mg at 03/23/13 0946;  nicotine (NICODERM CQ - dosed in mg/24 hours) patch 14 mg, 14 mg, Transdermal, Daily PRN, Linward Headland, MD;  ondansetron [facility]) injection 4 mg, 4 mg, Intravenous, Q6H PRN, Linward Headland, MD;  ondansetron (ZOFRAN-ODT) disintegrating tablet 4 mg, 4 mg, Oral, Q8H PRN, Na Li,  MD pantoprazole (PROTONIX) EC tablet 40 mg, 40 mg, Oral, Daily, Linward Headland, MD, 40 mg at 03/23/13 0946;  sodium chloride 0.9 % injection 10-40 mL, 10-40 mL, Intracatheter, Q12H, Inez Catalina, MD, 10 mL at 03/23/13 1000;  sodium chloride 0.9 % injection 10-40 mL, 10-40 mL, Intracatheter, PRN, Inez Catalina, MD, 20 mL at 03/22/13 1127;  traMADol (ULTRAM) tablet 50 mg, 50 mg, Oral, Q6H PRN, Dede Query, MD, 50 mg at 03/21/13 1655  Current results  Recent Results (from the past 2160 hour(s))  CBC WITH DIFFERENTIAL     Status: Abnormal   Collection Time    03/19/13  3:34 PM      Result Value Range   WBC 4.5  4.0 - 10.5 K/uL   RBC 4.25  3.87 - 5.11 MIL/uL   Hemoglobin 11.0 (*) 12.0 - 15.0 g/dL   HCT 40.9  81.1 - 91.4 %   MCV 84.7  78.0 - 100.0 fL   MCH 25.9 (*) 26.0 - 34.0 pg   MCHC 30.6  30.0 - 36.0 g/dL   RDW 78.2 (*) 95.6 - 21.3 %   Platelets 140 (*) 150 - 400 K/uL   Neutrophils Relative % 60  43 - 77 %   Neutro Abs 2.7  1.7 - 7.7 K/uL   Lymphocytes Relative 28  12 - 46 %   Lymphs Abs 1.3  0.7 - 4.0 K/uL   Monocytes Relative 10  3 - 12 %   Monocytes Absolute 0.4  0.1 - 1.0 K/uL   Eosinophils Relative 2  0 - 5 %   Eosinophils Absolute 0.1  0.0 - 0.7 K/uL   Basophils Relative 0  0 - 1 %   Basophils Absolute 0.0  0.0 - 0.1 K/uL  COMPREHENSIVE METABOLIC PANEL     Status: Abnormal   Collection Time    03/19/13  3:34 PM      Result Value Range   Sodium 139  135 - 145 mEq/L   Potassium 4.0  3.5 - 5.1 mEq/L   Chloride 106  96 - 112  mEq/L   CO2 23  19 - 32 mEq/L   Glucose, Bld 169 (*) 70 - 99 mg/dL   BUN 16  6 - 23 mg/dL   Creatinine, Ser 4.09  0.50 - 1.10 mg/dL   Calcium 9.0  8.4 - 81.1 mg/dL   Total Protein 6.6  6.0 - 8.3 g/dL   Albumin 3.2 (*) 3.5 - 5.2 g/dL   AST 16  0 - 37 U/L   ALT 7  0 - 35 U/L   Alkaline Phosphatase 52  39 - 117 U/L   Total Bilirubin 0.4  0.3 - 1.2 mg/dL   GFR calc non Af Amer 61 (*) >90 mL/min   GFR calc Af Amer 71 (*) >90 mL/min   Comment: (NOTE)     The  eGFR has been calculated using the CKD EPI equation.     This calculation has not been validated in all clinical situations.     eGFR's persistently <90 mL/min signify possible Chronic Kidney     Disease.  LIPASE, BLOOD     Status: None   Collection Time    03/19/13  3:34 PM      Result Value Range   Lipase 48  11 - 59 U/L  PRO B NATRIURETIC PEPTIDE     Status: Abnormal   Collection Time    03/19/13  3:35 PM      Result Value Range   Pro B Natriuretic peptide (BNP) 910.9 (*) 0 - 125 pg/mL  POCT I-STAT TROPONIN I     Status: None   Collection Time    03/19/13  3:39 PM      Result Value Range   Troponin i, poc 0.03  0.00 - 0.08 ng/mL   Comment 3            Comment: Due to the release kinetics of cTnI,     a negative result within the first hours     of the onset of symptoms does not rule out     myocardial infarction with certainty.     If myocardial infarction is still suspected,     repeat the test at appropriate intervals.  PROTIME-INR     Status: None   Collection Time    03/19/13  8:24 PM      Result Value Range   Prothrombin Time 12.8  11.6 - 15.2 seconds   INR 0.98  0.00 - 1.49  APTT     Status: Abnormal   Collection Time    03/19/13  8:24 PM      Result Value Range   aPTT 20 (*) 24 - 37 seconds  GLUCOSE, CAPILLARY     Status: None   Collection Time    03/19/13  9:12 PM      Result Value Range   Glucose-Capillary 88  70 - 99 mg/dL   Comment 1 Documented in Chart     Comment 2 Notify RN    TROPONIN I     Status: None   Collection Time    03/19/13 10:36 PM      Result Value Range   Troponin I <0.30  <0.30 ng/mL   Comment:            Due to the release kinetics of cTnI,     a negative result within the first hours     of the onset of symptoms does not rule out     myocardial infarction with certainty.     If  myocardial infarction is still suspected,     repeat the test at appropriate intervals.  PROTIME-INR     Status: None   Collection Time    03/19/13  10:36 PM      Result Value Range   Prothrombin Time 12.9  11.6 - 15.2 seconds   INR 0.99  0.00 - 1.49  HEMOGLOBIN A1C     Status: Abnormal   Collection Time    03/19/13 10:36 PM      Result Value Range   Hemoglobin A1C 6.8 (*) <5.7 %   Comment: (NOTE)                                                                               According to the ADA Clinical Practice Recommendations for 2011, when     HbA1c is used as a screening test:      >=6.5%   Diagnostic of Diabetes Mellitus               (if abnormal result is confirmed)     5.7-6.4%   Increased risk of developing Diabetes Mellitus     References:Diagnosis and Classification of Diabetes Mellitus,Diabetes     Care,2011,34(Suppl 1):S62-S69 and Standards of Medical Care in             Diabetes - 2011,Diabetes Care,2011,34 (Suppl 1):S11-S61.   Mean Plasma Glucose 148 (*) <117 mg/dL   Comment: Performed at [facility]  MAGNESIUM     Status: None   Collection Time    03/19/13 10:36 PM      Result Value Range   Magnesium 1.9  1.5 - 2.5 mg/dL  HEPARIN LEVEL (UNFRACTIONATED)     Status: Abnormal   Collection Time    03/20/13  5:10 AM      Result Value Range   Heparin Unfractionated <0.10 (*) 0.30 - 0.70 IU/mL   Comment:            IF HEPARIN RESULTS ARE BELOW     EXPECTED VALUES, AND PATIENT     DOSAGE HAS BEEN CONFIRMED,     SUGGEST FOLLOW UP TESTING     OF ANTITHROMBIN III LEVELS.  CBC     Status: Abnormal   Collection Time    03/20/13  5:10 AM      Result Value Range   WBC 4.9  4.0 - 10.5 K/uL   RBC 4.28  3.87 - 5.11 MIL/uL   Hemoglobin 11.3 (*) 12.0 - 15.0 g/dL   HCT 40.9  81.1 - 91.4 %   MCV 86.2  78.0 - 100.0 fL   MCH 26.4  26.0 - 34.0 pg   MCHC 30.6  30.0 - 36.0 g/dL   RDW 78.2 (*) 95.6 - 21.3 %   Platelets 117 (*) 150 - 400 K/uL   Comment: PLATELET COUNT CONFIRMED BY SMEAR  TROPONIN I     Status: None   Collection Time    03/20/13  5:10 AM      Result Value Range   Troponin I <0.30  <0.30 ng/mL    Comment:            Due to the release kinetics of cTnI,  a negative result within the first hours     of the onset of symptoms does not rule out     myocardial infarction with certainty.     If myocardial infarction is still suspected,     repeat the test at appropriate intervals.  BASIC METABOLIC PANEL     Status: Abnormal   Collection Time    03/20/13  5:10 AM      Result Value Range   Sodium 137  135 - 145 mEq/L   Potassium 4.5  3.5 - 5.1 mEq/L   Chloride 104  96 - 112 mEq/L   CO2 23  19 - 32 mEq/L   Glucose, Bld 111 (*) 70 - 99 mg/dL   BUN 16  6 - 23 mg/dL   Creatinine, Ser 0.27  0.50 - 1.10 mg/dL   Calcium 8.9  8.4 - 25.3 mg/dL   GFR calc non Af Amer 62 (*) >90 mL/min   GFR calc Af Amer 72 (*) >90 mL/min   Comment: (NOTE)     The eGFR has been calculated using the CKD EPI equation.     This calculation has not been validated in all clinical situations.     eGFR's persistently <90 mL/min signify possible Chronic Kidney     Disease.  LIPID PANEL     Status: Abnormal   Collection Time    03/20/13  5:10 AM      Result Value Range   Cholesterol 239 (*) 0 - 200 mg/dL   Triglycerides 664  <403 mg/dL   HDL 63  >47 mg/dL   Total CHOL/HDL Ratio 3.8     VLDL 20  0 - 40 mg/dL   LDL Cholesterol 425 (*) 0 - 99 mg/dL   Comment:            Total Cholesterol/HDL:CHD Risk     Coronary Heart Disease Risk Table                         Men   Women      1/2 Average Risk   3.4   3.3      Average Risk       5.0   4.4      2 X Average Risk   9.6   7.1      3 X Average Risk  23.4   11.0                Use the calculated Patient Ratio     above and the CHD Risk Table     to determine the patient's CHD Risk.                ATP III CLASSIFICATION (LDL):      <100     mg/dL   Optimal      956-387  mg/dL   Near or Above                        Optimal      130-159  mg/dL   Borderline      564-332  mg/dL   High      >951     mg/dL   Very High  GLUCOSE, CAPILLARY     Status: Abnormal    Collection Time    03/20/13  6:28 AM      Result Value Range   Glucose-Capillary 119 (*) 70 - 99 mg/dL  URINALYSIS,  ROUTINE W REFLEX MICROSCOPIC     Status: None   Collection Time    03/20/13 11:00 AM      Result Value Range   Color, Urine YELLOW  YELLOW   APPearance CLEAR  CLEAR   Specific Gravity, Urine 1.013  1.005 - 1.030   pH 5.5  5.0 - 8.0   Glucose, UA NEGATIVE  NEGATIVE mg/dL   Hgb urine dipstick NEGATIVE  NEGATIVE   Bilirubin Urine NEGATIVE  NEGATIVE   Ketones, ur NEGATIVE  NEGATIVE mg/dL   Protein, ur NEGATIVE  NEGATIVE mg/dL   Urobilinogen, UA 1.0  0.0 - 1.0 mg/dL   Nitrite NEGATIVE  NEGATIVE   Leukocytes, UA NEGATIVE  NEGATIVE   Comment: MICROSCOPIC NOT DONE ON URINES WITH NEGATIVE PROTEIN, BLOOD, LEUKOCYTES, NITRITE, OR GLUCOSE <1000 mg/dL.  GLUCOSE, CAPILLARY     Status: Abnormal   Collection Time    03/20/13 11:30 AM      Result Value Range   Glucose-Capillary 128 (*) 70 - 99 mg/dL   Comment 1 Notify RN    HEPARIN LEVEL (UNFRACTIONATED)     Status: None   Collection Time    03/20/13  2:57 PM      Result Value Range   Heparin Unfractionated 0.61  0.30 - 0.70 IU/mL   Comment:            IF HEPARIN RESULTS ARE BELOW     EXPECTED VALUES, AND PATIENT     DOSAGE HAS BEEN CONFIRMED,     SUGGEST FOLLOW UP TESTING     OF ANTITHROMBIN III LEVELS.  BLOOD GAS, ARTERIAL     Status: Abnormal   Collection Time    03/20/13  4:00 PM      Result Value Range   O2 Content 2.0     Delivery systems NASAL CANNULA     pH, Arterial 7.267 (*) 7.350 - 7.450   pCO2 arterial 62.4 (*) 35.0 - 45.0 mmHg   Comment: CRITICAL RESULT CALLED TO, READ BACK BY AND VERIFIED WITH:     LI NE MD AT 1750 BY EMILY TROGDON RRT,RCP ON 03/20/13   pO2, Arterial 76.6 (*) 80.0 - 100.0 mmHg   Bicarbonate 27.6 (*) 20.0 - 24.0 mEq/L   TCO2 29.5  0 - 100 mmol/L   Acid-Base Excess 1.3  0.0 - 2.0 mmol/L   O2 Saturation 96.1     Patient temperature 98.2     Collection site LEFT RADIAL     Drawn by  [id number]     Sample type ARTERIAL     Allens test (pass/fail) PASS  PASS  URINE RAPID DRUG SCREEN (HOSP PERFORMED)     Status: None   Collection Time    03/20/13  4:28 PM      Result Value Range   Opiates NONE DETECTED  NONE DETECTED   Cocaine NONE DETECTED  NONE DETECTED   Benzodiazepines NONE DETECTED  NONE DETECTED   Amphetamines NONE DETECTED  NONE DETECTED   Tetrahydrocannabinol NONE DETECTED  NONE DETECTED   Barbiturates NONE DETECTED  NONE DETECTED   Comment:            DRUG SCREEN FOR MEDICAL PURPOSES     ONLY.  IF CONFIRMATION IS NEEDED     FOR ANY PURPOSE, NOTIFY LAB     WITHIN 5 DAYS.                LOWEST DETECTABLE LIMITS     FOR URINE DRUG SCREEN  Drug Class       Cutoff (ng/mL)     Amphetamine      1000     Barbiturate      200     Benzodiazepine   200     Tricyclics       300     Opiates          300     Cocaine          300     THC              50  GLUCOSE, CAPILLARY     Status: Abnormal   Collection Time    03/20/13  5:02 PM      Result Value Range   Glucose-Capillary 105 (*) 70 - 99 mg/dL   Comment 1 Notify RN    BASIC METABOLIC PANEL     Status: Abnormal   Collection Time    03/20/13  6:18 PM      Result Value Range   Sodium 137  135 - 145 mEq/L   Potassium 3.9  3.5 - 5.1 mEq/L   Chloride 101  96 - 112 mEq/L   CO2 30  19 - 32 mEq/L   Glucose, Bld 106 (*) 70 - 99 mg/dL   BUN 18  6 - 23 mg/dL   Creatinine, Ser 5.32  0.50 - 1.10 mg/dL   Calcium 9.0  8.4 - 99.2 mg/dL   GFR calc non Af Amer 52 (*) >90 mL/min   GFR calc Af Amer 61 (*) >90 mL/min   Comment: (NOTE)     The eGFR has been calculated using the CKD EPI equation.     This calculation has not been validated in all clinical situations.     eGFR's persistently <90 mL/min signify possible Chronic Kidney     Disease.  MAGNESIUM     Status: None   Collection Time    03/20/13  6:18 PM      Result Value Range   Magnesium 1.8  1.5 - 2.5 mg/dL  MRSA PCR SCREENING     Status: None   Collection  Time    03/20/13  6:52 PM      Result Value Range   MRSA by PCR NEGATIVE  NEGATIVE   Comment:            The GeneXpert MRSA Assay (FDA     approved for NASAL specimens     only), is one component of a     comprehensive MRSA colonization     surveillance program. It is not     intended to diagnose MRSA     infection nor to guide or     monitor treatment for     MRSA infections.  TSH     Status: None   Collection Time    03/20/13  8:24 PM      Result Value Range   TSH 0.996  0.350 - 4.500 uIU/mL   Comment: Performed at [facility]  CBC WITH DIFFERENTIAL     Status: Abnormal   Collection Time    03/20/13  8:24 PM      Result Value Range   WBC 4.6  4.0 - 10.5 K/uL   RBC 4.05  3.87 - 5.11 MIL/uL   Hemoglobin 10.7 (*) 12.0 - 15.0 g/dL   HCT 42.6 (*) 83.4 - 19.6 %   MCV 87.2  78.0 - 100.0 fL   MCH 26.4  26.0 -  34.0 pg   MCHC 30.3  30.0 - 36.0 g/dL   RDW 45.4 (*) 09.8 - 11.9 %   Platelets 163  150 - 400 K/uL   Comment: DELTA CHECK NOTED     REPEATED TO VERIFY   Neutrophils Relative % 37 (*) 43 - 77 %   Neutro Abs 1.7  1.7 - 7.7 K/uL   Lymphocytes Relative 44  12 - 46 %   Lymphs Abs 2.0  0.7 - 4.0 K/uL   Monocytes Relative 16 (*) 3 - 12 %   Monocytes Absolute 0.7  0.1 - 1.0 K/uL   Eosinophils Relative 3  0 - 5 %   Eosinophils Absolute 0.1  0.0 - 0.7 K/uL   Basophils Relative 0  0 - 1 %   Basophils Absolute 0.0  0.0 - 0.1 K/uL  COMPREHENSIVE METABOLIC PANEL     Status: Abnormal   Collection Time    03/20/13  8:24 PM      Result Value Range   Sodium 137  135 - 145 mEq/L   Potassium 3.8  3.5 - 5.1 mEq/L   Chloride 101  96 - 112 mEq/L   CO2 29  19 - 32 mEq/L   Glucose, Bld 107 (*) 70 - 99 mg/dL   BUN 19  6 - 23 mg/dL   Creatinine, Ser 1.47  0.50 - 1.10 mg/dL   Calcium 9.0  8.4 - 82.9 mg/dL   Total Protein 6.4  6.0 - 8.3 g/dL   Albumin 3.2 (*) 3.5 - 5.2 g/dL   AST 12  0 - 37 U/L   ALT 8  0 - 35 U/L   Alkaline Phosphatase 53  39 - 117 U/L   Total Bilirubin 0.3   0.3 - 1.2 mg/dL   GFR calc non Af Amer 53 (*) >90 mL/min   GFR calc Af Amer 61 (*) >90 mL/min   Comment: (NOTE)     The eGFR has been calculated using the CKD EPI equation.     This calculation has not been validated in all clinical situations.     eGFR's persistently <90 mL/min signify possible Chronic Kidney     Disease.  MAGNESIUM     Status: None   Collection Time    03/20/13  8:24 PM      Result Value Range   Magnesium 1.9  1.5 - 2.5 mg/dL  GLUCOSE, CAPILLARY     Status: Abnormal   Collection Time    03/20/13  9:58 PM      Result Value Range   Glucose-Capillary 111 (*) 70 - 99 mg/dL  POCT I-STAT 3, BLOOD GAS (G3+)     Status: Abnormal   Collection Time    03/21/13  4:23 AM      Result Value Range   pH, Arterial 7.208 (*) 7.350 - 7.450   pCO2 arterial 72.7 (*) 35.0 - 45.0 mmHg   pO2, Arterial 84.0  80.0 - 100.0 mmHg   Bicarbonate 28.9 (*) 20.0 - 24.0 mEq/L   TCO2 31  0 - 100 mmol/L   O2 Saturation 93.0     Acid-base deficit 1.0  0.0 - 2.0 mmol/L   Patient temperature 98.6 F     Collection site RADIAL, ALLEN'S TEST ACCEPTABLE     Drawn by Operator     Sample type ARTERIAL     Comment NOTIFIED PHYSICIAN    GLUCOSE, CAPILLARY     Status: None   Collection Time    03/21/13  4:56 AM      Result Value Range   Glucose-Capillary 93  70 - 99 mg/dL  BASIC METABOLIC PANEL     Status: Abnormal   Collection Time    03/21/13  6:50 AM      Result Value Range   Sodium 136  135 - 145 mEq/L   Potassium 4.5  3.5 - 5.1 mEq/L   Chloride 101  96 - 112 mEq/L   CO2 27  19 - 32 mEq/L   Glucose, Bld 81  70 - 99 mg/dL   BUN 18  6 - 23 mg/dL   Creatinine, Ser 1.61  0.50 - 1.10 mg/dL   Calcium 8.7  8.4 - 09.6 mg/dL   GFR calc non Af Amer 52 (*) >90 mL/min   GFR calc Af Amer 61 (*) >90 mL/min   Comment: (NOTE)     The eGFR has been calculated using the CKD EPI equation.     This calculation has not been validated in all clinical situations.     eGFR's persistently <90 mL/min signify  possible Chronic Kidney     Disease.  MAGNESIUM     Status: None   Collection Time    03/21/13  6:50 AM      Result Value Range   Magnesium 1.9  1.5 - 2.5 mg/dL  GLUCOSE, CAPILLARY     Status: None   Collection Time    03/21/13  7:47 AM      Result Value Range   Glucose-Capillary 82  70 - 99 mg/dL  POCT I-STAT 3, BLOOD GAS (G3+)     Status: Abnormal   Collection Time    03/21/13  8:41 AM      Result Value Range   pH, Arterial 7.204 (*) 7.350 - 7.450   pCO2 arterial 76.9 (*) 35.0 - 45.0 mmHg   pO2, Arterial 130.0 (*) 80.0 - 100.0 mmHg   Bicarbonate 30.3 (*) 20.0 - 24.0 mEq/L   TCO2 33  0 - 100 mmol/L   O2 Saturation 98.0     Collection site RADIAL, ALLEN'S TEST ACCEPTABLE     Drawn by RT     Sample type ARTERIAL     Comment NOTIFIED PHYSICIAN    GLUCOSE, CAPILLARY     Status: Abnormal   Collection Time    03/21/13 12:06 PM      Result Value Range   Glucose-Capillary 117 (*) 70 - 99 mg/dL  GLUCOSE, CAPILLARY     Status: None   Collection Time    03/21/13  5:04 PM      Result Value Range   Glucose-Capillary 99  70 - 99 mg/dL  GLUCOSE, CAPILLARY     Status: Abnormal   Collection Time    03/21/13  9:33 PM      Result Value Range   Glucose-Capillary 184 (*) 70 - 99 mg/dL  BASIC METABOLIC PANEL     Status: Abnormal   Collection Time    03/22/13  5:00 AM      Result Value Range   Sodium 136  135 - 145 mEq/L   Potassium 3.4 (*) 3.5 - 5.1 mEq/L   Chloride 101  96 - 112 mEq/L   CO2 28  19 - 32 mEq/L   Glucose, Bld 101 (*) 70 - 99 mg/dL   BUN 18  6 - 23 mg/dL   Creatinine, Ser 0.45  0.50 - 1.10 mg/dL   Calcium 9.3  8.4 - 40.9 mg/dL   GFR calc  non Af Amer 58 (*) >90 mL/min   GFR calc Af Amer 68 (*) >90 mL/min   Comment: (NOTE)     The eGFR has been calculated using the CKD EPI equation.     This calculation has not been validated in all clinical situations.     eGFR's persistently <90 mL/min signify possible Chronic Kidney     Disease.  MAGNESIUM     Status: None    Collection Time    03/22/13  5:00 AM      Result Value Range   Magnesium 2.0  1.5 - 2.5 mg/dL  GLUCOSE, CAPILLARY     Status: None   Collection Time    03/22/13  8:05 AM      Result Value Range   Glucose-Capillary 89  70 - 99 mg/dL  GLUCOSE, CAPILLARY     Status: None   Collection Time    03/22/13 12:38 PM      Result Value Range   Glucose-Capillary 90  70 - 99 mg/dL  GLUCOSE, CAPILLARY     Status: Abnormal   Collection Time    03/22/13  4:38 PM      Result Value Range   Glucose-Capillary 100 (*) 70 - 99 mg/dL  GLUCOSE, CAPILLARY     Status: Abnormal   Collection Time    03/22/13  9:23 PM      Result Value Range   Glucose-Capillary 128 (*) 70 - 99 mg/dL  BASIC METABOLIC PANEL     Status: Abnormal   Collection Time    03/23/13  5:00 AM      Result Value Range   Sodium 139  135 - 145 mEq/L   Potassium 3.5  3.5 - 5.1 mEq/L   Chloride 102  96 - 112 mEq/L   CO2 28  19 - 32 mEq/L   Glucose, Bld 115 (*) 70 - 99 mg/dL   BUN 24 (*) 6 - 23 mg/dL   Creatinine, Ser 2.95  0.50 - 1.10 mg/dL   Calcium 9.8  8.4 - 62.1 mg/dL   GFR calc non Af Amer 52 (*) >90 mL/min   GFR calc Af Amer 61 (*) >90 mL/min   Comment: (NOTE)     The eGFR has been calculated using the CKD EPI equation.     This calculation has not been validated in all clinical situations.     eGFR's persistently <90 mL/min signify possible Chronic Kidney     Disease.  MAGNESIUM     Status: None   Collection Time    03/23/13  5:00 AM      Result Value Range   Magnesium 1.9  1.5 - 2.5 mg/dL  GLUCOSE, CAPILLARY     Status: None   Collection Time    03/23/13  7:25 AM      Result Value Range   Glucose-Capillary 95  70 - 99 mg/dL  URINALYSIS, ROUTINE W REFLEX MICROSCOPIC     Status: None   Collection Time    03/23/13  9:45 AM      Result Value Range   Color, Urine YELLOW  YELLOW   APPearance CLEAR  CLEAR   Specific Gravity, Urine 1.016  1.005 - 1.030   pH 6.0  5.0 - 8.0   Glucose, UA NEGATIVE  NEGATIVE mg/dL   Hgb  urine dipstick NEGATIVE  NEGATIVE   Bilirubin Urine NEGATIVE  NEGATIVE   Ketones, ur NEGATIVE  NEGATIVE mg/dL   Protein, ur NEGATIVE  NEGATIVE mg/dL  Urobilinogen, UA 1.0  0.0 - 1.0 mg/dL   Nitrite NEGATIVE  NEGATIVE   Leukocytes, UA NEGATIVE  NEGATIVE   Comment: MICROSCOPIC NOT DONE ON URINES WITH NEGATIVE PROTEIN, BLOOD, LEUKOCYTES, NITRITE, OR GLUCOSE <1000 mg/dL.  GLUCOSE, CAPILLARY     Status: Abnormal   Collection Time    03/23/13 11:32 AM      Result Value Range   Glucose-Capillary 121 (*) 70 - 99 mg/dL   Assessment Axis I depressive disorder NOS  Plan. Patient at this time does not require inpatient psychiatric services.  Discontinue Abilify, patient is not taking Abilify because of increase QTC.  Patient is not interested in any psychotropic medication at this time.  She like to followup outpatient services for the management of her depression.  Social worker to arrange appointment for counseling at [facility].  Will sign off from psychiatric liaison services please call C/L services if needed in the future.  Thank you

## 2013-03-23 NOTE — Progress Notes (Signed)
Subjective: Patient continues to be less than cooperative with her oxygen.  She continues to have significant periods of hypoxia.  Resting this morning but easily awakened.    Objective: Current vital signs: BP 139/46  Pulse 66  Temp(Src) 98.1 F (36.7 C) (Oral)  Resp 16  Ht 5\' 2"  (1.575 m)  Wt 123.6 kg (272 lb 7.8 oz)  BMI 49.83 kg/m2  SpO2 97% Vital signs in last 24 hours: Temp:  [98.1 F (36.7 C)-98.4 F (36.9 C)] 98.1 F (36.7 C) (09/01 0724) Pulse Rate:  [47-75] 66 (09/01 0724) Resp:  [16-22] 16 (09/01 0724) BP: (99-141)/(38-70) 139/46 mmHg (09/01 0724) SpO2:  [92 %-99 %] 97 % (09/01 0826) Weight:  [123.6 kg (272 lb 7.8 oz)] 123.6 kg (272 lb 7.8 oz) (09/01 0500)  Intake/Output from previous day: 08/31 0701 - 09/01 0700 In: 240 [P.O.:240] Out: 800 [Urine:800] Intake/Output this shift:   Nutritional status: Carb Control  Neurologic Exam: Mental Status:  Lethargic but easily awakened.  Follows commands without difficulty.  Speech fluent. Cranial Nerves:  II: Discs flat bilaterally; Visual fields grossly normal, pupils equal, round, reactive to light and accommodation  III,IV, VI: ptosis not present, extra-ocular motions intact bilaterally  V,VII: smile symmetric, facial light touch sensation normal bilaterally  VIII: hearing normal bilaterally  IX,X: gag reflex present  XI: bilateral shoulder shrug  XII: midline tongue extension  Motor:  Right : Upper extremity 5/5       Left: Upper extremity 5/5   Lower extremity 5/5     Lower extremity 5/5  Tone and bulk:normal tone throughout; no atrophy noted  Sensory: Pinprick and light touch intact throughout, bilaterally  Deep Tendon Reflexes:  1+ throughout  Lab Results: Basic Metabolic Panel:  Recent Labs Lab 03/20/13 1818 03/20/13 2024 03/21/13 0650 03/22/13 0500 03/23/13 0500  NA 137 137 136 136 139  K 3.9 3.8 4.5 3.4* 3.5  CL 101 101 101 101 102  CO2 30 29 27 28 28   GLUCOSE 106* 107* 81 101* 115*  BUN  18 19 18 18  24*  CREATININE 1.06 1.05 1.06 0.97 1.06  CALCIUM 9.0 9.0 8.7 9.3 9.8  MG 1.8 1.9 1.9 2.0 1.9    Liver Function Tests:  Recent Labs Lab 03/19/13 1534 03/20/13 2024  AST 16 12  ALT 7 8  ALKPHOS 52 53  BILITOT 0.4 0.3  PROT 6.6 6.4  ALBUMIN 3.2* 3.2*    Recent Labs Lab 03/19/13 1534  LIPASE 48   No results found for this basename: AMMONIA,  in the last 168 hours  CBC:  Recent Labs Lab 03/19/13 1534 03/20/13 0510 03/20/13 2024  WBC 4.5 4.9 4.6  NEUTROABS 2.7  --  1.7  HGB 11.0* 11.3* 10.7*  HCT 36.0 36.9 35.3*  MCV 84.7 86.2 87.2  PLT 140* 117* 163    Cardiac Enzymes:  Recent Labs Lab 03/19/13 2236 03/20/13 0510  TROPONINI <0.30 <0.30    Lipid Panel:  Recent Labs Lab 03/20/13 0510  CHOL 239*  TRIG 101  HDL 63  CHOLHDL 3.8  VLDL 20  LDLCALC 161*    CBG:  Recent Labs Lab 03/22/13 0805 03/22/13 1238 03/22/13 1638 03/22/13 2123 03/23/13 0725  GLUCAP 89 90 100* 128* 95    Microbiology: Results for orders placed during the hospital encounter of 03/19/13  MRSA PCR SCREENING     Status: None   Collection Time    03/20/13  6:52 PM      Result Value Range Status  MRSA by PCR NEGATIVE  NEGATIVE Final   Comment:            The GeneXpert MRSA Assay (FDA     approved for NASAL specimens     only), is one component of a     comprehensive MRSA colonization     surveillance program. It is not     intended to diagnose MRSA     infection nor to guide or     monitor treatment for     MRSA infections.    Coagulation Studies: No results found for this basename: LABPROT, INR,  in the last 72 hours  Imaging: Dg Chest Port 1 View  03/21/2013   *RADIOLOGY REPORT*  Clinical Data: PICC placement.  PORTABLE CHEST - 1 VIEW  Comparison: Single view of the chest 03/19/2013.  Findings: Right PICC is in place the tip projecting over the lower superior vena cava.  There is cardiomegaly without edema.  Lungs are clear.  No pneumothorax or  pleural fluid.  AICD noted.  IMPRESSION:  1.  Tip of right PICC projects over the lower superior vena cava. 2.  Cardiomegaly without acute disease.   Original Report Authenticated By: Holley Dexter, M.D.    Medications:  I have reviewed the patient's current medications. Scheduled: . albuterol  2.5 mg Nebulization Q6H  . aspirin EC  81 mg Oral Daily  . atorvastatin  80 mg Oral q1800  . carvedilol  25 mg Oral BID WC  . furosemide  40 mg Intravenous BID  . heparin subcutaneous  5,000 Units Subcutaneous Q8H  . insulin aspart  0-15 Units Subcutaneous TID WC  . insulin aspart  0-5 Units Subcutaneous QHS  . insulin glargine  15 Units Subcutaneous QHS  . ipratropium  0.5 mg Nebulization Q6H  . levETIRAcetam  1,000 mg Oral BID  . lisinopril  5 mg Oral Daily  . pantoprazole  40 mg Oral Daily  . sodium chloride  10-40 mL Intracatheter Q12H    Assessment/Plan: Patient has shown no evidence of seizure activity.  Lethargy likely secondary to respiratory status.  On Keppra.  Recommendations: 1.  Continue Keppra at current dose 2.  No further neurologic intervention is recommended at this time.  If further questions arise, please call or page at that time.  Thank you for allowing neurology to participate in the care of this patient.    LOS: 4 days   Thana Farr, MD [facility] [phone] 03/23/2013  8:33 AM

## 2013-03-23 NOTE — Progress Notes (Signed)
Patient Name: Kelly Phelps Date of Encounter: 03/23/2013    SUBJECTIVE: No cardiac complaints voiced. She specifically denies pain and dyspnea.  TELEMETRY:  Normal sinus rhythm : Filed Vitals:   03/23/13 0343 03/23/13 0500 03/23/13 0724 03/23/13 0826  BP: 141/40  139/46   Pulse: 59  66   Temp:   98.1 F (36.7 C)   TempSrc:   Oral   Resp:   16   Height:      Weight:  123.6 kg (272 lb 7.8 oz)    SpO2: 92%  94% 97%    Intake/Output Summary (Last 24 hours) at 03/23/13 1004 Last data filed at 03/23/13 0800  Gross per 24 hour  Intake    480 ml  Output      0 ml  Net    480 ml   Net I/O: -3300 cc since admission WEIGHT: has decreased by 10 pounds since admission  LABS: Basic Metabolic Panel:  Recent Labs  40/98/11 0500 03/23/13 0500  NA 136 139  K 3.4* 3.5  CL 101 102  CO2 28 28  GLUCOSE 101* 115*  BUN 18 24*  CREATININE 0.97 1.06  CALCIUM 9.3 9.8  MG 2.0 1.9   CBC:  Recent Labs  03/20/13 2024  WBC 4.6  NEUTROABS 1.7  HGB 10.7*  HCT 35.3*  MCV 87.2  PLT 163   Radiology/Studies:  No new data  Physical Exam: Blood pressure 139/46, pulse 66, temperature 98.1 F (36.7 C), temperature source Oral, resp. rate 16, height 5\' 2"  (1.575 m), weight 123.6 kg (272 lb 7.8 oz), SpO2 97.00%. Weight change: -3.5 kg (-7 lb 11.5 oz)   Chest is clear to auscultation and percussion.  Cardiac exam is unremarkable. No obvious gallop or murmur is heard.  No edema  ASSESSMENT:  1. Stable chronic systolic heart failure, nonischemic  2. AICD  3. No complaint of chest pain today. No evidence of infarction or injury since admission.   Plan:  1. Continue current heart failure therapy, but we will convert furosemide to oral dosing, as BUN is increasing.  Windy Fast W 03/23/2013, 10:04 AM

## 2013-03-23 NOTE — Progress Notes (Signed)
Subjective: Patient much improved today, much more alert today and she is answering questions appropriately. She denies SOB, CP, SI/HI, AV hallucinations. She has been ambulating without SOB. She feels much less tired this morning, but she is still a bit drowsy. Satting 99% on 2LPM O2 on my exam.   Objective: Vital signs in last 24 hours: Filed Vitals:   03/23/13 0500 03/23/13 0724 03/23/13 0826 03/23/13 1130  BP:  139/46  126/61  Pulse:  66  73  Temp:  98.1 F (36.7 C)  98.2 F (36.8 C)  TempSrc:  Oral  Oral  Resp:  16  16  Height:      Weight: 123.6 kg (272 lb 7.8 oz)     SpO2:  94% 97% 90%   Weight change: -3.5 kg (-7 lb 11.5 oz)  Intake/Output Summary (Last 24 hours) at 03/23/13 1332 Last data filed at 03/23/13 1158  Gross per 24 hour  Intake    720 ml  Output   1500 ml  Net   -780 ml   Physical Exam General: Easily arousable, answers questions appropriately; sitting upright in her bedside chair Neck: supple Lungs: CTA B/L Heart: RRR, No M/G/R Abdomen: central obesity, soft, nontender, normoactive BS Ext: trace BLE edema  Lab Results: Basic Metabolic Panel:  Recent Labs Lab 03/22/13 0500 03/23/13 0500  NA 136 139  K 3.4* 3.5  CL 101 102  CO2 28 28  GLUCOSE 101* 115*  BUN 18 24*  CREATININE 0.97 1.06  CALCIUM 9.3 9.8  MG 2.0 1.9   Liver Function Tests:  Recent Labs Lab 03/19/13 1534 03/20/13 2024  AST 16 12  ALT 7 8  ALKPHOS 52 53  BILITOT 0.4 0.3  PROT 6.6 6.4  ALBUMIN 3.2* 3.2*    Recent Labs Lab 03/19/13 1534  LIPASE 48   CBC:  Recent Labs Lab 03/19/13 1534 03/20/13 0510 03/20/13 2024  WBC 4.5 4.9 4.6  NEUTROABS 2.7  --  1.7  HGB 11.0* 11.3* 10.7*  HCT 36.0 36.9 35.3*  MCV 84.7 86.2 87.2  PLT 140* 117* 163   Cardiac Enzymes:  Recent Labs Lab 03/19/13 2236 03/20/13 0510  TROPONINI <0.30 <0.30   BNP:  Recent Labs Lab 03/19/13 1535  PROBNP 910.9*   CBG:  Recent Labs Lab 03/22/13 0805 03/22/13 1238  03/22/13 1638 03/22/13 2123 03/23/13 0725 03/23/13 1132  GLUCAP 89 90 100* 128* 95 121*   Hemoglobin A1C:  Recent Labs Lab 03/19/13 2236  HGBA1C 6.8*   Fasting Lipid Panel:  Recent Labs Lab 03/20/13 0510  CHOL 239*  HDL 63  LDLCALC 156*  TRIG 101  CHOLHDL 3.8   Thyroid Function Tests:  Recent Labs Lab 03/20/13 2024  TSH 0.996   Coagulation:  Recent Labs Lab 03/19/13 2024 03/19/13 2236  LABPROT 12.8 12.9  INR 0.98 0.99    Drugs of Abuse     Component Value Date/Time   LABOPIA NONE DETECTED 03/20/2013 1628   COCAINSCRNUR NONE DETECTED 03/20/2013 1628   LABBENZ NONE DETECTED 03/20/2013 1628   AMPHETMU NONE DETECTED 03/20/2013 1628   THCU NONE DETECTED 03/20/2013 1628   LABBARB NONE DETECTED 03/20/2013 1628    Urinalysis:  Recent Labs Lab 03/20/13 1100 03/23/13 0945  COLORURINE YELLOW YELLOW  LABSPEC 1.013 1.016  PHURINE 5.5 6.0  GLUCOSEU NEGATIVE NEGATIVE  HGBUR NEGATIVE NEGATIVE  BILIRUBINUR NEGATIVE NEGATIVE  KETONESUR NEGATIVE NEGATIVE  PROTEINUR NEGATIVE NEGATIVE  UROBILINOGEN 1.0 1.0  NITRITE NEGATIVE NEGATIVE  LEUKOCYTESUR NEGATIVE NEGATIVE  Micro Results: Recent Results (from the past 240 hour(s))  MRSA PCR SCREENING     Status: None   Collection Time    03/20/13  6:52 PM      Result Value Range Status   MRSA by PCR NEGATIVE  NEGATIVE Final   Comment:            The GeneXpert MRSA Assay (FDA     approved for NASAL specimens     only), is one component of a     comprehensive MRSA colonization     surveillance program. It is not     intended to diagnose MRSA     infection nor to guide or     monitor treatment for     MRSA infections.   Studies/Results: Dg Chest Port 1 View  03/21/2013   *RADIOLOGY REPORT*  Clinical Data: PICC placement.  PORTABLE CHEST - 1 VIEW  Comparison: Single view of the chest 03/19/2013.  Findings: Right PICC is in place the tip projecting over the lower superior vena cava.  There is cardiomegaly without  edema.  Lungs are clear.  No pneumothorax or pleural fluid.  AICD noted.  IMPRESSION:  1.  Tip of right PICC projects over the lower superior vena cava. 2.  Cardiomegaly without acute disease.   Original Report Authenticated By: Holley Dexter, M.D.   Medications: I have reviewed the patient's current medications. Scheduled Meds: . albuterol  2.5 mg Nebulization Q6H  . aspirin EC  81 mg Oral Daily  . atorvastatin  80 mg Oral q1800  . carvedilol  25 mg Oral BID WC  . furosemide  40 mg Oral BID  . heparin subcutaneous  5,000 Units Subcutaneous Q8H  . insulin aspart  0-15 Units Subcutaneous TID WC  . insulin aspart  0-5 Units Subcutaneous QHS  . insulin glargine  15 Units Subcutaneous QHS  . ipratropium  0.5 mg Nebulization Q6H  . levETIRAcetam  1,000 mg Oral BID  . lisinopril  5 mg Oral Daily  . pantoprazole  40 mg Oral Daily  . sodium chloride  10-40 mL Intracatheter Q12H   Continuous Infusions: . sodium chloride 10 mL/hr (03/19/13 2016)   PRN Meds:.acetaminophen, albuterol, nicotine, ondansetron (ZOFRAN) IV, ondansetron, sodium chloride, traMADol  Assessment/Plan:   # Acute-on-chronic hypercapnic respiratory failure due to OHS/OSA  # Acute encephalopathy, improved  # Obesity hypoventilation syndrome     Patient was noted to have acute encephalopathy 2/2 acute on chronic hypercarbic respiratory failure due to OHS/OSA on 03/20/13. She was transferred to step down and treated with BiPAP. Acidotic,acetazolamide D/C'd 8/30, which was likely contributing to her acidosis. PCCM was consulted. PICC placed, as pt w/o access. Her mental status has improved and she will be transferred to tele floor today. - PCCM following, appreciate recommendations  - transfer to floor today - BiPAP at night - O2 goal to keep at 90-94% per PCCM  #Chest pain--noncardiac, no further workup indicated by cardiology. Heparin drip initially started and DC'd. Old records obtained - scanned into epic - t wave  inversions on EKG present from previous 01/20/13, no other cardiac records. Troponin and EKG unremarkable this admission.   # Acute on chronic CHF with ICD. 2D ECHO done 8/30 that showed dialted LV with mild LVH. EF 30% with diffuse hypokinesis. Grade 1 diastolic dysfunction.  - Cardiology following, appreciate input - Lasix 40 IV twice a day - Continue Coreg and lisinopril per cardiology recommendation. - Strict I & Os  - Daily weight    #  Epilepsy- H/o seizures, supposed to be on Keppra at home but with medical noncompliance.  Reports off medications for at least one month. CT head negative. Neurology consulted, and no further neurologic testing indicated per the recommendation - Neurology following, appreciate recommnedations - Continue Keppra  #Suicide Ideation - Pt with history of depression - Sitter/suicidal precaution  -Psych consult Dr. Lolly Mustache - recommended Abilify 2 mg daily if not medically contraindicated- however, patient with long QT, so we have not started this medication  -psych will reevaluate today  # Hypertension -losartan and hydralazine (no nitrate, allergy) on home med list, but pt notes non-compliance. BP borderline low.  - on Coreg and lisinopril  #Diabetes mellitus type II   Recent Labs Lab 03/22/13 1238 03/22/13 1638 03/22/13 2123 03/23/13 0725 03/23/13 1132  GLUCAP 90 100* 128* 95 121*   - Lantus 15 units QHS - SSI  # Homelessness - living with niece-in-law, unable to afford medications  -consult CSW, CM   # Hyperlipidemia - chronic, stable  -start atorvastatin   # Tobacco abuse - encourage cessation  -ordered tobacco abuse counseling  -nicotine patch daily prn   # DVT PPx - Heparin [place] and SCDs.   # Code status: Full  Dispo: Disposition is deferred at this time, awaiting improvement of current medical problems.  Anticipated discharge in approximately 1-2 day(s).   The patient does not have a current PCP (No primary provider on file.) and  does need an [facility] hospital follow-up appointment after discharge.  The patient does not have transportation limitations that hinder transportation to clinic appointments.  .Services Needed at time of discharge: Y = Yes, Blank = No PT:   Home health vs SNF  OT:   RN:   Equipment:  Rolling walker with 5" wheels  Other:     LOS: 4 days   Windell Hummingbird, MD 03/23/2013, 1:32 PM

## 2013-03-23 NOTE — Clinical Social Work Note (Signed)
Clinical Social Work Department CLINICAL SOCIAL WORK PLACEMENT NOTE 03/23/2013  Patient:  LY, WASS  Account Number:  [id number] Admit date:  03/19/2013  Clinical Social Worker:  Hulan Fray  Date/time:  03/23/2013 03:36 PM  Clinical Social Work is seeking post-discharge placement for this patient at the following level of care:   SKILLED NURSING   (*CSW will update this form in Epic as items are completed)   03/23/2013  Patient/family provided with [facility] Department of Clinical Social Work's list of facilities offering this level of care within the geographic area requested by the patient (or if unable, by the patient's family).  03/23/2013  Patient/family informed of their freedom to choose among providers that offer the needed level of care, that participate in Medicare, Medicaid or managed care program needed by the patient, have an available bed and are willing to accept the patient.  03/23/2013  Patient/family informed of [facility]' ownership interest in [facility], as well as of the fact that they are under no obligation to receive care at this facility.  PASARR submitted to EDS on 03/23/2013 PASARR number received from EDS on 03/23/2013  FL2 transmitted to all facilities in geographic area requested by pt/family on  03/23/2013 FL2 transmitted to all facilities within larger geographic area on   Patient informed that his/her managed care company has contracts with or will negotiate with  certain facilities, including the following:     Patient/family informed of bed offers received:   Patient chooses bed at  Physician recommends and patient chooses bed at    Patient to be transferred to  on   Patient to be transferred to facility by   The following physician request were entered in Epic:   Additional Comments:

## 2013-03-23 NOTE — Progress Notes (Signed)
Placed placed on CPAP. Patient tolerating well at this time.

## 2013-03-23 NOTE — Evaluation (Signed)
Occupational Therapy Evaluation Patient Details Name: Kelly Phelps MRN: [id number] DOB: April 20, 1946 Today's Date: 03/23/2013 Time: 4098-1191 OT Time Calculation (min): 31 min  OT Assessment / Plan / Recommendation History of present illness Patient is a 67 y/o female admitted with SOB, chest tightness and pain with PMH positive for hypertension, hyperlipidemia, type 2 diabetes, and tobacco use (1 pack a week since age 88). She also has history CHF with  AICD in place since Nov of 2013.   Clinical Impression   Pt admitted with above. Pt currently with functional limitations due to the deficits listed below (see OT Problem List).  Pt will benefit from skilled OT to increase their safety and independence with ADL and functional mobility for ADL to facilitate discharge to venue listed below.       OT Assessment  Patient needs continued OT Services    Follow Up Recommendations  SNF    Barriers to Discharge Decreased caregiver support;Inaccessible home environment    Equipment Recommendations  3 in 1 bedside comode       Frequency  Min 2X/week    Precautions / Restrictions Precautions Precautions: Fall Precaution Comments: Reports 4 falls in 6 months, gets up unaided Restrictions Weight Bearing Restrictions: No   Pertinent Vitals/Pain HR and O2 sats remained stable on 1liter of O2    ADL  Eating/Feeding: Independent Where Assessed - Eating/Feeding: Chair Grooming: Set up Where Assessed - Grooming: Unsupported sitting Upper Body Bathing: Minimal assistance Where Assessed - Upper Body Bathing: Unsupported sitting Lower Body Bathing: Moderate assistance Where Assessed - Lower Body Bathing: Supported sit to stand Upper Body Dressing: Minimal assistance Where Assessed - Upper Body Dressing: Unsupported sitting Lower Body Dressing: Moderate assistance Where Assessed - Lower Body Dressing: Supported sit to stand Toilet Transfer: Minimal assistance Statistician Method: Sit to  Barista:  (Recliner>out door, down hall>back to recliner) Toileting - Clothing Manipulation and Hygiene: Moderate assistance Where Assessed - Toileting Clothing Manipulation and Hygiene: Sit to stand from 3-in-1 or toilet Equipment Used: Gait belt (W/C as RW, O2 at 1 liter) Transfers/Ambulation Related to ADLs: Min A for all with W/C as RW ADL Comments: bends forward to doff/donn socks    OT Diagnosis: Generalized weakness  OT Problem List: Impaired balance (sitting and/or standing);Decreased knowledge of precautions OT Treatment Interventions: Self-care/ADL training;Balance training;DME and/or AE instruction;Patient/family education   OT Goals(Current goals can be found in the care plan section) Acute Rehab OT Goals Patient Stated Goal: I can't go back to where I was living, I just can't take it any more" OT Goal Formulation: With patient Time For Goal Achievement: 04/06/13 Potential to Achieve Goals: Good  Visit Information  Last OT Received On: 03/23/13 Assistance Needed: +1 History of Present Illness: Patient is a 67 y/o female admitted with SOB, chest tightness and pain with PMH positive for hypertension, hyperlipidemia, type 2 diabetes, and tobacco use (1 pack a week since age 57). She also has history CHF with  AICD in place since Nov of 2013.       Prior Functioning     Home Living Family/patient expects to be discharged to:: Private residence Living Arrangements: Other relatives Available Help at Discharge: Available PRN/intermittently Type of Home: House Home Access: Level entry Home Layout: Two level Alternate Level Stairs-Number of Steps: 2 flights to bedrooms Alternate Level Stairs-Rails: Right Home Equipment: Cane - quad Additional Comments: HHA 2 hours 7 days a week; helps with cooking, light cleaning and helping pt wash up Prior  Function Level of Independence: Independent with assistive device(s);Needs assistance ADL's / Homemaking  Assistance Needed: IADLs and some bathing Communication Communication: No difficulties Dominant Hand: Right         Vision/Perception Vision - History Patient Visual Report: No change from baseline   Cognition  Cognition Arousal/Alertness: Awake/alert Behavior During Therapy: WFL for tasks assessed/performed Overall Cognitive Status: Within Functional Limits for tasks assessed    Extremity/Trunk Assessment Upper Extremity Assessment Upper Extremity Assessment: Overall WFL for tasks assessed     Mobility Bed Mobility Details for Bed Mobility Assistance: Pt up in recliner upon arrival Transfers Transfers: Sit to Stand;Stand to Sit Sit to Stand: 4: Min assist;With upper extremity assist;With armrests;From chair/3-in-1 Stand to Sit: 4: Min assist;With upper extremity assist;With armrests;To chair/3-in-1 Details for Transfer Assistance: VCs for safe hand placement and to control descent           End of Session OT - End of Session Activity Tolerance: Patient tolerated treatment well Patient left: in chair;with nursing/sitter in room       Evette Georges [phone] 03/23/2013, 9:47 AM

## 2013-03-23 NOTE — Progress Notes (Signed)
Patient stated to RT that she was having trouble with the nasal mask. She stated that she wears a full face mask at home. RT gave pt a full facemask. RN aware. Patient is tolerating well.

## 2013-03-23 NOTE — Progress Notes (Signed)
I spoke with Dr. Lolly Mustache with psychiatry this evening at 5:55pm and he recommends discontinuation of sitter for patient as she is no longer considered a risk to herself or others.   [facility], PGY1 Pager [phone] 03/23/2013, 6:00 PM
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
## 2013-03-24 ENCOUNTER — Encounter (HOSPITAL_COMMUNITY): Payer: Self-pay | Admitting: Cardiology

## 2013-03-24 DIAGNOSIS — I420 Dilated cardiomyopathy: Secondary | ICD-10-CM | POA: Insufficient documentation

## 2013-03-24 DIAGNOSIS — Z95810 Presence of automatic (implantable) cardiac defibrillator: Secondary | ICD-10-CM | POA: Insufficient documentation

## 2013-03-24 DIAGNOSIS — E119 Type 2 diabetes mellitus without complications: Secondary | ICD-10-CM

## 2013-03-24 LAB — GLUCOSE, CAPILLARY
Glucose-Capillary: 86 mg/dL (ref 70–99)
Glucose-Capillary: 112 mg/dL — ABNORMAL HIGH (ref 70–99)
Glucose-Capillary: 165 mg/dL — ABNORMAL HIGH (ref 70–99)

## 2013-03-24 LAB — BASIC METABOLIC PANEL
BUN: 31 mg/dL — ABNORMAL HIGH (ref 6–23)
CO2: 29 mEq/L (ref 19–32)
Chloride: 102 mEq/L (ref 96–112)
Glucose, Bld: 99 mg/dL (ref 70–99)
Potassium: 3.3 mEq/L — ABNORMAL LOW (ref 3.5–5.1)
Sodium: 139 mEq/L (ref 135–145)

## 2013-03-24 MED ORDER — POTASSIUM CHLORIDE CRYS ER 20 MEQ PO TBCR: 40.0000 meq | EXTENDED_RELEASE_TABLET | Freq: Once | ORAL | Status: DC

## 2013-03-24 NOTE — Progress Notes (Addendum)
Subjective: Patient slept well last night and denies SOB. She denies CP or any other symptom today. She has been ambulating without SOB. She does report that she has a CPAP machine at home, but that it is nonfunctioning.    Objective: Vital signs in last 24 hours: Filed Vitals:   03/24/13 0625 03/24/13 0629 03/24/13 0805 03/24/13 1350  BP:  115/73 105/65 109/40  Pulse:  75 70 64  Temp:  98.7 F (37.1 C)  97.5 F (36.4 C)  TempSrc:  Oral  Oral  Resp:  18  18  Height:      Weight:  123.9 kg (273 lb 2.4 oz)    SpO2: 100%   98%   Weight change: 0.3 kg (10.6 oz)  Intake/Output Summary (Last 24 hours) at 03/24/13 1526 Last data filed at 03/24/13 1311  Gross per 24 hour  Intake    620 ml  Output      0 ml  Net    620 ml   Physical Exam General: Patient is awake during my exam and does not appear drowsy today; answers questions appropriately Neck: supple Lungs: CTA B/L Heart: RRR, No M/G/R Abdomen: central obesity, soft, nontender, normoactive BS Ext: trace BLE edema; PICC in place to RUE, peripheral IV to L hand  Lab Results: Basic Metabolic Panel:  Recent Labs Lab 03/23/13 0500 03/24/13 0439  NA 139 139  K 3.5 3.3*  CL 102 102  CO2 28 29  GLUCOSE 115* 99  BUN 24* 31*  CREATININE 1.06 1.25*  CALCIUM 9.8 9.4  MG 1.9 1.8   Liver Function Tests:  Recent Labs Lab 03/19/13 1534 03/20/13 2024  AST 16 12  ALT 7 8  ALKPHOS 52 53  BILITOT 0.4 0.3  PROT 6.6 6.4  ALBUMIN 3.2* 3.2*    Recent Labs Lab 03/19/13 1534  LIPASE 48   CBC:  Recent Labs Lab 03/19/13 1534 03/20/13 0510 03/20/13 2024  WBC 4.5 4.9 4.6  NEUTROABS 2.7  --  1.7  HGB 11.0* 11.3* 10.7*  HCT 36.0 36.9 35.3*  MCV 84.7 86.2 87.2  PLT 140* 117* 163   Cardiac Enzymes:  Recent Labs Lab 03/19/13 2236 03/20/13 0510  TROPONINI <0.30 <0.30   BNP:  Recent Labs Lab 03/19/13 1535  PROBNP 910.9*   CBG:  Recent Labs Lab 03/23/13 0725 03/23/13 1132 03/23/13 1645  03/23/13 2111 03/24/13 0618 03/24/13 1100  GLUCAP 95 121* 123* 140* 86 112*   Hemoglobin A1C:  Recent Labs Lab 03/19/13 2236  HGBA1C 6.8*   Fasting Lipid Panel:  Recent Labs Lab 03/20/13 0510  CHOL 239*  HDL 63  LDLCALC 156*  TRIG 101  CHOLHDL 3.8   Thyroid Function Tests:  Recent Labs Lab 03/20/13 2024  TSH 0.996   Coagulation:  Recent Labs Lab 03/19/13 2024 03/19/13 2236  LABPROT 12.8 12.9  INR 0.98 0.99    Drugs of Abuse     Component Value Date/Time   LABOPIA NONE DETECTED 03/20/2013 1628   COCAINSCRNUR NONE DETECTED 03/20/2013 1628   LABBENZ NONE DETECTED 03/20/2013 1628   AMPHETMU NONE DETECTED 03/20/2013 1628   THCU NONE DETECTED 03/20/2013 1628   LABBARB NONE DETECTED 03/20/2013 1628    Urinalysis:  Recent Labs Lab 03/20/13 1100 03/23/13 0945  COLORURINE YELLOW YELLOW  LABSPEC 1.013 1.016  PHURINE 5.5 6.0  GLUCOSEU NEGATIVE NEGATIVE  HGBUR NEGATIVE NEGATIVE  BILIRUBINUR NEGATIVE NEGATIVE  KETONESUR NEGATIVE NEGATIVE  PROTEINUR NEGATIVE NEGATIVE  UROBILINOGEN 1.0 1.0  NITRITE NEGATIVE NEGATIVE  LEUKOCYTESUR NEGATIVE NEGATIVE   Micro Results: Recent Results (from the past 240 hour(s))  MRSA PCR SCREENING     Status: None   Collection Time    03/20/13  6:52 PM      Result Value Range Status   MRSA by PCR NEGATIVE  NEGATIVE Final   Comment:            The GeneXpert MRSA Assay (FDA     approved for NASAL specimens     only), is one component of a     comprehensive MRSA colonization     surveillance program. It is not     intended to diagnose MRSA     infection nor to guide or     monitor treatment for     MRSA infections.   Studies/Results: No results found. Medications: I have reviewed the patient's current medications. Scheduled Meds: . albuterol  2.5 mg Nebulization Q6H  . aspirin EC  81 mg Oral Daily  . atorvastatin  80 mg Oral q1800  . carvedilol  25 mg Oral BID WC  . furosemide  40 mg Oral BID  . heparin subcutaneous   5,000 Units Subcutaneous Q8H  . insulin aspart  0-15 Units Subcutaneous TID WC  . insulin aspart  0-5 Units Subcutaneous QHS  . insulin glargine  15 Units Subcutaneous QHS  . ipratropium  0.5 mg Nebulization Q6H  . levETIRAcetam  1,000 mg Oral BID  . lisinopril  5 mg Oral Daily  . pantoprazole  40 mg Oral Daily  . potassium chloride  40 mEq Oral Once  . sodium chloride  10-40 mL Intracatheter Q12H   Continuous Infusions: . sodium chloride 10 mL/hr (03/19/13 2016)   PRN Meds:.acetaminophen, albuterol, nicotine, ondansetron (ZOFRAN) IV, ondansetron, sodium chloride, traMADol  Assessment/Plan:   # Acute-on-chronic hypercapnic respiratory failure due to OHS/OSA, resolved  # Acute encephalopathy, resolved  # Obesity hypoventilation syndrome     Patient was noted to have acute encephalopathy 2/2 acute on chronic hypercarbic respiratory failure due to OHS/OSA on 03/20/13. She was transferred to step down and treated with BiPAP. Acidotic,acetazolamide D/C'd 8/30, which was likely contributing to her acidosis. PCCM was consulted. PICC placed, as pt w/o access. Her mental status has improved and she will be transferred to tele floor today. - tele - BiPAP at night - O2 goal to keep at 90-94%- she is satting 98% on room air -patient will need to be on CPAP at the SNF and she will need to obtain a CPAP machine at some point as an outpatient  #Chest pain--noncardiac, no further workup indicated by cardiology. Heparin drip initially started and DC'd. Old records obtained - scanned into epic - t wave inversions on EKG present from previous 01/20/13, no other cardiac records. Troponin and EKG unremarkable this admission.   # Acute on chronic CHF with ICD. 2D ECHO done 8/30 that showed dialted LV with mild LVH. EF 30% with diffuse hypokinesis. Grade 1 diastolic dysfunction.  - Cardiology following, appreciate input - Lasix 40 IV twice a day- Cr is uptrending today, so per cards will consider decreasing to  40mg  daily  - Continue Coreg and lisinopril per cardiology recommendation. - Strict I & Os  - Daily weight    # Epilepsy- H/o seizures, supposed to be on Keppra at home but with medication noncompliance.  Reports off medications for at least one month. CT head negative. Neurology consulted, and no further neurologic testing indicated per the recommendation - Neurology following, appreciate  recommnedations - Continue Keppra  #Suicide Ideation - Pt with history of depression -psych signed off- patient no longer actively having SI/HI; no longer needs abilify; patient will follow up as an outpatient with psych as per Dr. Sheela Stack note  # Hypertension -losartan and hydralazine are on home med list, but pt notes non-compliance. BP borderline low.  - on Coreg and lisinopril  #Diabetes mellitus type II   Recent Labs Lab 03/23/13 1132 03/23/13 1645 03/23/13 2111 03/24/13 0618 03/24/13 1100  GLUCAP 121* 123* 140* 86 112*   - Lantus 15 units QHS - SSI - CBG well controlled   # Homelessness - living with niece-in-law, unable to afford medications  -consult CSW, CM   # Hyperlipidemia - chronic, stable  -start atorvastatin   # Tobacco abuse - encourage cessation  -ordered tobacco abuse counseling  -nicotine patch daily prn   # DVT PPx - Heparin [place] and SCDs.   # Code status: Full  Dispo: Disposition is deferred at this time, awaiting improvement of current medical problems.  Anticipated discharge in approximately 1 day(s).   The patient does not have a current PCP (No primary provider on file.) and does need an [facility] hospital follow-up appointment after discharge.  The patient does not have transportation limitations that hinder transportation to clinic appointments.  .Services Needed at time of discharge: Y = Yes, Blank = No PT:   Home health vs SNF  OT:   RN:   Equipment:  Rolling walker with 5" wheels  Other:     LOS: 5 days   Windell Hummingbird, MD 03/24/2013, 3:26 PM

## 2013-03-24 NOTE — Progress Notes (Signed)
Physical Therapy Treatment Patient Details Name: Kelly Phelps MRN: [id number] DOB: [birth year] Today's Date: 03/24/2013 Time: 3244-0102 PT Time Calculation (min): 29 min  PT Assessment / Plan / Recommendation  History of Present Illness Patient is a 67 y/o female admitted with SOB, chest tightness and pain with PMH positive for hypertension, hyperlipidemia, type 2 diabetes, and tobacco use (1 pack a week since age 29). She also has history CHF with  AICD in place since Nov of 2013.   PT Comments   Pt progressing with gait but very slow gait speed. Pt returned to chair with lunch tray end of session and educated for bil LE HEP which she states she will perform later. Pt continues to demonstrate need for RW and states that at home even when ambulating stairs it took her one hour to complete steps. Will continue to follow to maximize function.   Follow Up Recommendations        Does the patient have the potential to tolerate intense rehabilitation     Barriers to Discharge        Equipment Recommendations       Recommendations for Other Services    Frequency     Progress towards PT Goals Progress towards PT goals: Progressing toward goals  Plan Current plan remains appropriate    Precautions / Restrictions Precautions Precautions: Fall   Pertinent Vitals/Pain Pain in back grossly 3/10 with gait, no pain at rest   Mobility  Bed Mobility Bed Mobility: Not assessed Transfers Sit to Stand: 4: Min guard;From toilet Stand to Sit: 4: Min guard;To chair/3-in-1 Details for Transfer Assistance: VCs for safe hand placement  Ambulation/Gait Ambulation/Gait Assistance: 4: Min guard Ambulation Distance (Feet): 170 Feet Assistive device: Rolling walker Ambulation/Gait Assistance Details: pt with very slow gait to complete gait with repeated stopping rest breaks propping on forearms on the walker stating it relieves her chronic back pain Gait Pattern: Step-through pattern;Decreased stride  length;Trunk flexed;Narrow base of support Gait velocity: very slow Stairs: No    Exercises     PT Diagnosis:    PT Problem List:   PT Treatment Interventions:     PT Goals (current goals can now be found in the care plan section)    Visit Information  Last PT Received On: 03/24/13 Assistance Needed: +1 History of Present Illness: Patient is a 66 y/o female admitted with SOB, chest tightness and pain with PMH positive for hypertension, hyperlipidemia, type 2 diabetes, and tobacco use (1 pack a week since age 7). She also has history CHF with  AICD in place since Nov of 2013.    Subjective Data      Cognition  Cognition Arousal/Alertness: Awake/alert Behavior During Therapy: WFL for tasks assessed/performed Overall Cognitive Status: Impaired/Different from baseline Area of Impairment: Safety/judgement Safety/Judgement: Decreased awareness of safety    Balance     End of Session PT - End of Session Equipment Utilized During Treatment: Gait belt Activity Tolerance: Patient tolerated treatment well Patient left: in chair;with call bell/phone within reach Nurse Communication: Mobility status   GP     Delorse Lek 03/24/2013, 1:24 PM Delaney Meigs, PT [phone]

## 2013-03-24 NOTE — Discharge Summary (Signed)
Name: Kelly Phelps MRN: [id number] DOB: 1946-03-27 67 y.o. PCP: None  Date of Admission: 03/19/2013  1:57 PM Date of Discharge: 03/25/2013 Attending Physician: Dr. Kem Kays  Discharge Diagnosis: Principal Problem:    Acute-on-chronic respiratory failure- likely OSA in combination with Obesity Hypoventilation Syndrome Active Problems:   Hypertension   Diabetes mellitus without complication   Tobacco abuse   Congestive heart failure   Obesity hypoventilation syndrome   Discharge Medications:   Medication List    STOP taking these medications       acetaZOLAMIDE 250 MG tablet  Commonly known as:  DIAMOX     doxycycline 100 MG DR capsule  Commonly known as:  DORYX     hydrALAZINE 25 MG tablet  Commonly known as:  APRESOLINE     losartan 25 MG tablet  Commonly known as:  COZAAR     meloxicam 15 MG tablet  Commonly known as:  MOBIC      TAKE these medications       aspirin 81 MG tablet  Take 81 mg by mouth daily.     carvedilol 25 MG tablet  Commonly known as:  COREG  Take 25 mg by mouth 2 (two) times daily with a meal.     DULoxetine 60 MG capsule  Commonly known as:  CYMBALTA  Take 60 mg by mouth daily.     furosemide 40 MG tablet  Commonly known as:  LASIX  Take 40 mg by mouth.     insulin glargine 100 UNIT/ML injection  Commonly known as:  LANTUS  Inject 26 Units into the skin at bedtime.     levETIRAcetam 1000 MG tablet  Commonly known as:  KEPPRA  Take 1,000 mg by mouth 2 (two) times daily.     linagliptin 5 MG Tabs tablet  Commonly known as:  TRADJENTA  Take 5 mg by mouth daily.     omeprazole 20 MG capsule  Commonly known as:  PRILOSEC  Take 20 mg by mouth daily.     potassium chloride 10 MEQ CR capsule  Commonly known as:  MICRO-K  Take 10 mEq by mouth 2 (two) times daily.     rosuvastatin 20 MG tablet  Commonly known as:  CRESTOR  Take 20 mg by mouth daily.       Disposition and follow-up:   Kelly Phelps was discharged from [facility] in Stable condition.  At the hospital follow up visit please address:  1.  OSA- patient will need to obtain a new CPAP machine as she has one that is broken 2. Hematochezia- this is an ongoing problem x 5 years- patient will likely need a colonoscopy to find a source 3. HTN/CHF- Hydralazine was not restarted upon discharge since her BP remained stable- consider restarting   2.  Labs / imaging needed at time of follow-up: CBC, BMP, colonoscopy  3.  Pending labs/ test needing follow-up: none  Follow-up Appointments: Follow-up Information   Follow up with Jackie Plum, MD On 04/07/2013. (11:15am)    Contact information:   [address], [place], [place] [address] [phone]      Follow up with [facility] R, NP On 04/08/2013. (10:20 am)    Specialty:  Cardiology   Contact information:   [address] [place] [address] [phone]       Discharge Instructions: Discharge Orders   Future Appointments Provider Department Dept Phone   04/08/2013 10:20 AM Nada Boozer, NP [facility] [place] [phone]   Future Orders Complete By Expires   (HEART FAILURE PATIENTS) Call MD:  Anytime you have any of the following symptoms: 1) 3 pound weight gain in 24 hours or 5 pounds in 1 week 2) shortness of breath, with or without a dry hacking cough 3) swelling in the hands, feet or stomach 4) if you have to sleep on extra pillows at night in order to breathe.  As directed    Call MD for:  difficulty breathing, headache or visual disturbances  As directed    Call MD for:  persistant dizziness or light-headedness  As directed    Diet - low sodium heart healthy  As directed    Increase activity slowly  As directed       Consultations:  Cardiology, Psychiatry  Procedures Performed:  Dg Chest 2 View  03/19/2013   *RADIOLOGY REPORT*  Clinical Data: Chest pain  CHEST - 2 VIEW  Comparison: None.  Findings: Cardiac shadow is  enlarged.  A defibrillator is noted. The lungs are clear.  No acute bony abnormality is noted.  IMPRESSION: No acute abnormalities seen.   Original Report Authenticated By: Alcide Clever, M.D.   Ct Head Wo Contrast  03/20/2013   *RADIOLOGY REPORT*  Clinical Data: Acute encephalopathy.  History of epilepsy.  CT HEAD WITHOUT CONTRAST  Technique:  Contiguous axial images were obtained from the base of the skull through the vertex without contrast.  Comparison: None.  Findings:  Skull:No acute osseous abnormality.  Orbits: Symmetric proptosis.  There is related herniation of the lacrimal glands.  There is slight to enlargement of the medial recti relative to the lateral recti, raising the possibility of Grave's orbitopathy.  Bilateral cataract resection.  Brain: No evidence of acute abnormality, such as acute infarction, hemorrhage, hydrocephalus, or mass lesion/mass effect.  IMPRESSION:  1.  No evidence of acute intracranial disease. 2.  Proptosis.   Original Report Authenticated By: Tiburcio Pea   Dg Chest Port 1 View  03/21/2013   *RADIOLOGY REPORT*  Clinical Data: PICC placement.  PORTABLE CHEST - 1 VIEW  Comparison: Single view of the chest 03/19/2013.  Findings: Right PICC is in place the tip projecting over the lower superior vena cava.  There is cardiomegaly without edema.  Lungs are clear.  No pneumothorax or pleural fluid.  AICD noted.  IMPRESSION:  1.  Tip of right PICC projects over the lower superior vena cava. 2.  Cardiomegaly without acute disease.   Original Report Authenticated By: Holley Dexter, M.D.    2D Echo 8/30: Moderately dilated LV with mild LV hypertrophy. EF 30% with global hypokinesis. Normal RV size and systolic function. No significant valvular abnormalities.  Admission HPI:  Patient is a 67 yo AA female with pmh of hypertension, T2DM, HL, epilepsy, afib (on aspirin), CHF with AICD placement Nov 2011, CAD s/p MI's, sleep apnea, and tobacco use who presents with substernal  chest pain and tightness for past 5 days that has worsened 2 days ago. Pt describes the substernal chest pain as squeezing with the feeling of something sitting on her chest. Pain is 10/10 and radiates to left arm with associated diaphoresis, weakness, palpitations, dyspnea, cough, nausea, and vomiting. Pain is worse with activity such as walking and climbing stairs and improves with rest. Episode can last for about up to an 1 hour. She has been having these episodes for some time but in the past 5 days they have been occuring at rest. Symptoms are not worsened  by food or deep breathing.   Patient has past history of cardiac disease with MI in 1979 and latest in Jan 2013. She has had exercise stress testing in past but no stent placement. Family history significant for MI in mother (died at age 45), sister(age 42), and daughter (age 33).   She has history of hypertension, hyperlipidemia, type 2 diabetes, and tobacco use (1 pack a week since age 85). She also has history CHF with AICD in place since Nov of 2013. She admits that due to problem with obtaining medications due to homelessness, she has not been on diuretic therapy for past couple of months and noticed increased lower extremity swelling. She reports for the past 4-5 months she has been sleeping upright on 5-6 pillows with orthopnea and PND for past 1-2 months.   She has history of afib and previously on coumadin but stopped due to rectal bleeding. She is currently on aspirin therapy for anticoagulation.   Hospital Course by problem list:   # Acute-on-chronic hypercapnic respiratory failure due to OHS/OSA, resolved  # Acute encephalopathy, resolved  # Obesity hypoventilation syndrome  Patient was noted to have acute encephalopathy 2/2 acute on chronic hypercarbic respiratory failure due to OHS/OSA on 03/20/13 at which time she was somnolent and difficult to arouse, but still able to follow commands. PCCM was consulted, she was transferred to  step down and treated with BiPAP. ABG revealed acute respiratory acidosis. Acetazolamide D/C'd 8/30, which was likely contributing to her acidosis. PICC placed, as pt w/o access. She was managed on BiPAP initially and gradually weaned to supplemental O2 via [place] with BiPAP at night only. She was then weaned off O2 and by hospital day 5, she was satting 98% on room air. Her mental status gradually improved to baseline over the course of 3-4 days- at time of discharge her mental status was at baseline. She reports that her home CPAP machine is broken, so she will need CPAP while at the SNF and will need to obtain a new CPAP machine when she goes back home.  #Hematochezia- Patient reports having blood in her stool for the past three days. This is a chronic and intermittent problem for her as she has had similar episodes for the past 5 years. She has no symptoms of anemia. She reports having had a colonoscopy at [facility] in [place] three years ago, but she doesn't remember what they found if anything. STAT CBC showed Hb 11 (up from previous Hb this admission). Unable to obtain records from [facility] prior to her discharge. Patient will need PCP f/u and will need to be scheduled for a colonoscopy as an outpatient.  #Chest pain--Patient initially presented with CP. Patient was originally started on heparin drip, but since both EKG and troponins were unremarkable this admission, it was quickly discontinued. Cardiology saw patient and deemed the pain noncardiac, no further workup indicated.   # Acute on chronic CHF with ICD. 2D ECHO done 8/30 that showed dialted LV with mild LVH. EF 30% with diffuse hypokinesis. Grade 1 diastolic dysfunction. Patient was managed on lasix 40mg  bid, but given patient Cr began to rise (1.06 on admission-->1.31), we decreased the frequency to lasix 40mg  daily as this is her home regimen. We originally held patient's home losartan, but after a few days of stable BP we added lisinopril  to her regimen- this may also explain the increase in Cr. We managed patient on her home dose of coreg. Patient was intermittently hypokalemic,  supplemented as needed.  # Epilepsy- H/o seizures, supposed to be on Keppra at home but with medication noncompliance. Reports off medications for at least one month. CT head negative. Neurology consulted, and no further neurologic testing indicated per the recommendation. Continued keppra at discharge.  #Suicide Ideation - Pt with history of depression. Patient was tearful and endorsed suicidal thoughts in the first few days of her hospitalization. Psych was consulted and recommended suicide precautions, sitter, and starting abilify. We did not start abilify as patient had long QT. As she recovered and became medically stable, her mood improved. We re-consulted psych and they stated she was no longer suicidal and they no longer recommended abilify, though they did state she will be set up with their outpatient clinic for follow up. We discontinued sitter and patient's mood remained stable throughout the rest of her stay.  # Hypertension- Patient had been on losartan and hydralazine at home, but pt notes non-compliance. BP was borderline-low on admission, but soon stabilized at which point she was managed on Coreg and lisinopril. Hydralazine was not restarted at discharge, but if her BP increases she may benefit from restarting this medication especially with consideration of her CHF. BP 128/58 on discharge.  #Diabetes mellitus type II - CBGs well controlled (86-165) during her stay with Lantus 15U QHS and SSI. Restarted home regimen at discharge.  # Hyperlipidemia - Chronic, stable- started atorvastatin during her admission. Restarted her home rosuvastatin at discharge.  Discharge Vitals:   BP 128/58  Pulse 54  Temp(Src) 98 F (36.7 C) (Axillary)  Resp 18  Ht 5\' 2"  (1.575 m)  Wt 123.378 kg (272 lb)  BMI 49.74 kg/m2  SpO2 93%  Discharge Labs:  Results  for orders placed during the hospital encounter of 03/19/13 (from the past 24 hour(s))  GLUCOSE, CAPILLARY     Status: Abnormal   Collection Time    03/24/13  4:07 PM      Result Value Range   Glucose-Capillary 165 (*) 70 - 99 mg/dL  GLUCOSE, CAPILLARY     Status: Abnormal   Collection Time    03/24/13  9:11 PM      Result Value Range   Glucose-Capillary 124 (*) 70 - 99 mg/dL   Comment 1 Documented in Chart     Comment 2 Notify RN    BASIC METABOLIC PANEL     Status: Abnormal   Collection Time    03/25/13  5:00 AM      Result Value Range   Sodium 142  135 - 145 mEq/L   Potassium 3.4 (*) 3.5 - 5.1 mEq/L   Chloride 103  96 - 112 mEq/L   CO2 29  19 - 32 mEq/L   Glucose, Bld 126 (*) 70 - 99 mg/dL   BUN 36 (*) 6 - 23 mg/dL   Creatinine, Ser 1.61 (*) 0.50 - 1.10 mg/dL   Calcium 9.2  8.4 - 09.6 mg/dL   GFR calc non Af Amer 41 (*) >90 mL/min   GFR calc Af Amer 47 (*) >90 mL/min  MAGNESIUM     Status: None   Collection Time    03/25/13  5:00 AM      Result Value Range   Magnesium 1.8  1.5 - 2.5 mg/dL  GLUCOSE, CAPILLARY     Status: Abnormal   Collection Time    03/25/13  6:56 AM      Result Value Range   Glucose-Capillary 124 (*) 70 - 99 mg/dL  CBC WITH  DIFFERENTIAL     Status: Abnormal   Collection Time    03/25/13  9:10 AM      Result Value Range   WBC 4.9  4.0 - 10.5 K/uL   RBC 4.13  3.87 - 5.11 MIL/uL   Hemoglobin 11.0 (*) 12.0 - 15.0 g/dL   HCT 45.4 (*) 09.8 - 11.9 %   MCV 83.3  78.0 - 100.0 fL   MCH 26.6  26.0 - 34.0 pg   MCHC 32.0  30.0 - 36.0 g/dL   RDW 14.7 (*) 82.9 - 56.2 %   Platelets 154  150 - 400 K/uL   Neutrophils Relative % 40 (*) 43 - 77 %   Neutro Abs 2.0  1.7 - 7.7 K/uL   Lymphocytes Relative 43  12 - 46 %   Lymphs Abs 2.1  0.7 - 4.0 K/uL   Monocytes Relative 13 (*) 3 - 12 %   Monocytes Absolute 0.7  0.1 - 1.0 K/uL   Eosinophils Relative 3  0 - 5 %   Eosinophils Absolute 0.2  0.0 - 0.7 K/uL   Basophils Relative 0  0 - 1 %   Basophils Absolute 0.0   0.0 - 0.1 K/uL  GLUCOSE, CAPILLARY     Status: Abnormal   Collection Time    03/25/13 11:06 AM      Result Value Range   Glucose-Capillary 122 (*) 70 - 99 mg/dL    Signed: Windell Hummingbird, MD 03/25/2013, 12:48 PM   Time Spent on Discharge: 35 minutes Services Ordered on Discharge: SNF Equipment Ordered on Discharge: none

## 2013-03-24 NOTE — Progress Notes (Signed)
Patient did not want to ambulate this evening. Will continue to monitor.

## 2013-03-24 NOTE — Progress Notes (Signed)
CSW met with patient, niece and niece's bf at bedside. Niece stated she wants to look at the bed offers and do her research before picking a facility. Niece stated she will call CSW tomorrow morning with a facility. CSW will update tomorrow.   Maree Krabbe, MSW, [facility] [phone]

## 2013-03-24 NOTE — Progress Notes (Signed)
  Date: 03/24/2013  Patient name: Kelly Phelps  Medical record number: [id number]  Date of birth: May 13, 1946   This patient has been seen and the plan of care was discussed with the house staff. Please see their note for complete details. I concur with their findings with the following additions/corrections:  Clinically improved.  Non-cardiac CP resolved.  Cardiomyopathy is compensated, appreciate cards recs.  Pending SNF placement.  Jonah Blue, DO, FACP Faculty [facility] Residency Program 03/24/2013, 12:05 PM

## 2013-03-24 NOTE — Progress Notes (Signed)
Clinical Social Worker met with patient to provide a list of skilled nursing facility bed offers. Patient continuously fell asleep as CSW was giving bed offers. CSW asked patient if she could call her niece. Patient gave permission to call her niece. CSW spoke with niece who stated that she will be at the hospital around 3pm and wants to talk to her aunt first before they decide on a facility. CSW will meet with niece and patient today and will update when new information arises.   Maree Krabbe, MSW, [facility] [phone]

## 2013-03-24 NOTE — Progress Notes (Signed)
Subjective:  Sleepy. No complaints  Objective:  Vital Signs in the last 24 hours: Temp:  [98.2 F (36.8 C)-98.7 F (37.1 C)] 98.7 F (37.1 C) (09/02 0629) Pulse Rate:  [70-75] 70 (09/02 0805) Resp:  [16-18] 18 (09/02 0629) BP: (105-126)/(48-75) 105/65 mmHg (09/02 0805) SpO2:  [90 %-100 %] 100 % (09/02 0625) Weight:  [123.9 kg (273 lb 2.4 oz)] 123.9 kg (273 lb 2.4 oz) (09/02 0629)  Intake/Output from previous day: 09/01 0701 - 09/02 0700 In: 480 [P.O.:480] Out: 1500 [Urine:1500]   Physical Exam: General: Well developed, well nourished, in no acute distress. Head:  Normocephalic and atraumatic. Lungs: Clear to auscultation and percussion. Heart: Normal S1 and S2.  Distant HS. No murmur, rubs or gallops.  Abdomen: soft, non-tender, positive bowel sounds. Obese Extremities: No clubbing or cyanosis. No edema. Neurologic: Alert and oriented x 3.    Lab Results: No results found for this basename: WBC, HGB, PLT,  in the last 72 hours  Recent Labs  03/23/13 0500 03/24/13 0439  NA 139 139  K 3.5 3.3*  CL 102 102  CO2 28 29  GLUCOSE 115* 99  BUN 24* 31*  CREATININE 1.06 1.25*      Telemetry: occas V- paced (ICD) Personally viewed.   EKG:  Unchanged from prior in sent records NUC: no ischemia 7/13 - EF <30% ECHO: 4/13 - EF 15-20%   Assessment/Plan:  Principal Problem:   Anginal chest pain at rest Active Problems:   Hypertension   Diabetes mellitus without complication   Tobacco abuse   Congestive heart failure   Acute-on-chronic respiratory failure   Obesity hypoventilation syndrome   1) Severe dilated cardiomyopathy  - appears fairly well compensated  - no changes  - BUN/creat increasing (changed to PO 40mg  BID lasix yesterday), monitor. May need further decrease to 40 QD.   2) Abnormal ECG  - same as prior in med records on shadow chart  - no changes  3) Severe sleep apnea/hypercapnea  - old issue. Diamox used in past according to records.  4)  ICD  - stable  5) Chest pain  - also chronic at times. Likely non cardiac. No complaints currently.  - no further cardiac testing  6) HL  - lipitor  7) Hypokalemia  - replete.    Auther Lyerly 03/24/2013, 8:25 AM

## 2013-03-25 DIAGNOSIS — I10 Essential (primary) hypertension: Secondary | ICD-10-CM

## 2013-03-25 LAB — CBC WITH DIFFERENTIAL/PLATELET
Basophils Relative: 0 % (ref 0–1)
Eosinophils Relative: 3 % (ref 0–5)
HCT: 34.4 % — ABNORMAL LOW (ref 36.0–46.0)
Hemoglobin: 11 g/dL — ABNORMAL LOW (ref 12.0–15.0)
Lymphocytes Relative: 43 % (ref 12–46)
MCHC: 32 g/dL (ref 30.0–36.0)
MCV: 83.3 fL (ref 78.0–100.0)
Monocytes Absolute: 0.7 10*3/uL (ref 0.1–1.0)
Monocytes Relative: 13 % — ABNORMAL HIGH (ref 3–12)
Neutro Abs: 2 10*3/uL (ref 1.7–7.7)

## 2013-03-25 LAB — BASIC METABOLIC PANEL
BUN: 36 mg/dL — ABNORMAL HIGH (ref 6–23)
Calcium: 9.2 mg/dL (ref 8.4–10.5)
Chloride: 103 mEq/L (ref 96–112)
Creatinine, Ser: 1.31 mg/dL — ABNORMAL HIGH (ref 0.50–1.10)
GFR calc Af Amer: 47 mL/min — ABNORMAL LOW (ref >90)
GFR calc non Af Amer: 41 mL/min — ABNORMAL LOW (ref >90)

## 2013-03-25 LAB — MAGNESIUM: Magnesium: 1.8 mg/dL (ref 1.5–2.5)

## 2013-03-25 MED ORDER — IPRATROPIUM BROMIDE 0.02 % IN SOLN
0.5000 mg | Freq: Two times a day (BID) | RESPIRATORY_TRACT | Status: DC
  Administered 2013-03-25: 0.5 mg via RESPIRATORY_TRACT
  Filled 2013-03-25: qty 2.5

## 2013-03-25 MED ORDER — ALBUTEROL SULFATE (5 MG/ML) 0.5% IN NEBU
2.5000 mg | INHALATION_SOLUTION | Freq: Two times a day (BID) | RESPIRATORY_TRACT | Status: DC
  Administered 2013-03-25: 2.5 mg via RESPIRATORY_TRACT
  Filled 2013-03-25: qty 0.5

## 2013-03-25 MED ORDER — ALBUTEROL SULFATE (5 MG/ML) 0.5% IN NEBU: 2.5000 mg | INHALATION_SOLUTION | Freq: Four times a day (QID) | RESPIRATORY_TRACT | Status: DC | PRN

## 2013-03-25 MED ORDER — POTASSIUM CHLORIDE CRYS ER 20 MEQ PO TBCR
30.0000 meq | EXTENDED_RELEASE_TABLET | Freq: Once | ORAL | Status: AC
  Administered 2013-03-25: 30 meq via ORAL
  Filled 2013-03-25: qty 1

## 2013-03-25 MED ORDER — FUROSEMIDE 40 MG PO TABS: 40.0000 mg | ORAL_TABLET | Freq: Every day | ORAL | Status: DC

## 2013-03-25 NOTE — Progress Notes (Signed)
Reported called to [facility] for patient transfer. PICC line was removed per IVT an hour ago. Gown changed. Awaiting EMS.Mamie Levers

## 2013-03-25 NOTE — Progress Notes (Signed)
Placed pt. On CPAP via [facility]FM (what pt. Wears at home). CPAP was adjusted to 7cm H2O per pt. Comfort. Pt. Is tolerating CPAP well at this time without any complications.

## 2013-03-25 NOTE — Discharge Summary (Signed)
  Date: 03/25/2013  Patient name: Kelly Phelps  Medical record number: [id number]  Date of birth: 01/08/1946   This patient has been seen and the plan of care was discussed with the house staff. Please see their note for complete details. I concur with their findings and plan.  Jonah Blue, DO, FACP Faculty [facility] Internal Medicine Residency Program 03/25/2013, 3:53 PM

## 2013-03-25 NOTE — Progress Notes (Signed)
  Date: 03/25/2013  Patient name: Kelly Phelps  Medical record number: [id number]  Date of birth: [birth year]   This patient has been seen and the plan of care was discussed with the house staff. Please see their note for complete details. I concur with their findings with the following additions/corrections: Feels well today. Admits to episodes of intermittent BRBPR for past few years. She admits to another episode in past 24 hrs. Denies CP, SOB, dizziness. No further episodes of bleeding. She states she's had multiple colonoscopies in past few years, but does not know results. She mentions both [place] and [place]. H/H is stable. She will need outpatient follow up, records requests, and possibly repeat colonoscopy based on previous results. She understands. Will arrange for her to have a new PCP. Otherwise, she is hemodynamically stable.  She is medically stable for discharge to facility.  Jonah Blue, DO, FACP Faculty [facility] Internal Medicine Residency Program 03/25/2013, 11:36 AM

## 2013-03-25 NOTE — Progress Notes (Signed)
Subjective: Patient slept well last night with CPAP. She denies SOB, CP. She endorses having visible red blood in her stool for the past few days, no known hx of hemorrhoids. She has had lower GI bleeding intermittently for the past 5 years, unknown source. She did have a colonoscopy in [place] at [facility] three years ago- unsure of what the result was. Denies dizziness or feeling weak.   Objective: Vital signs in last 24 hours: Filed Vitals:   03/25/13 0732 03/25/13 0942 03/25/13 1012 03/25/13 1022  BP: 145/65  128/58 128/58  Pulse: 65  54   Temp:      TempSrc:      Resp: 20  18   Height:      Weight:      SpO2:  93%     Weight change: -0.522 kg (-1 lb 2.4 oz)  Intake/Output Summary (Last 24 hours) at 03/25/13 1101 Last data filed at 03/25/13 0900  Gross per 24 hour  Intake    840 ml  Output    202 ml  Net    638 ml   Physical Exam General: Patient is awake and walking around her room during my exam; she no longer appears drowsy; answers questions appropriately HEENT: no conjunctival pallor, MMM Neck: supple Lungs: CTA B/L Heart: RRR, No M/G/R Abdomen: central obesity, soft, nontender, normoactive BS Ext: trace BLE edema; PICC in place to RUE, peripheral IV to L hand  Lab Results: Basic Metabolic Panel:  Recent Labs Lab 03/24/13 0439 03/25/13 0500  NA 139 142  K 3.3* 3.4*  CL 102 103  CO2 29 29  GLUCOSE 99 126*  BUN 31* 36*  CREATININE 1.25* 1.31*  CALCIUM 9.4 9.2  MG 1.8 1.8   Liver Function Tests:  Recent Labs Lab 03/19/13 1534 03/20/13 2024  AST 16 12  ALT 7 8  ALKPHOS 52 53  BILITOT 0.4 0.3  PROT 6.6 6.4  ALBUMIN 3.2* 3.2*    Recent Labs Lab 03/19/13 1534  LIPASE 48   CBC:  Recent Labs Lab 03/20/13 2024 03/25/13 0910  WBC 4.6 4.9  NEUTROABS 1.7 2.0  HGB 10.7* 11.0*  HCT 35.3* 34.4*  MCV 87.2 83.3  PLT 163 154   Cardiac Enzymes:  Recent Labs Lab 03/19/13 2236 03/20/13 0510  TROPONINI <0.30 <0.30    BNP:  Recent Labs Lab 03/19/13 1535  PROBNP 910.9*   CBG:  Recent Labs Lab 03/23/13 1645 03/23/13 2111 03/24/13 0618 03/24/13 1100 03/24/13 1607 03/24/13 2111  GLUCAP 123* 140* 86 112* 165* 124*   Hemoglobin A1C:  Recent Labs Lab 03/19/13 2236  HGBA1C 6.8*   Fasting Lipid Panel:  Recent Labs Lab 03/20/13 0510  CHOL 239*  HDL 63  LDLCALC 156*  TRIG 101  CHOLHDL 3.8   Thyroid Function Tests:  Recent Labs Lab 03/20/13 2024  TSH 0.996   Coagulation:  Recent Labs Lab 03/19/13 2024 03/19/13 2236  LABPROT 12.8 12.9  INR 0.98 0.99    Drugs of Abuse     Component Value Date/Time   LABOPIA NONE DETECTED 03/20/2013 1628   COCAINSCRNUR NONE DETECTED 03/20/2013 1628   LABBENZ NONE DETECTED 03/20/2013 1628   AMPHETMU NONE DETECTED 03/20/2013 1628   THCU NONE DETECTED 03/20/2013 1628   LABBARB NONE DETECTED 03/20/2013 1628    Urinalysis:  Recent Labs Lab 03/20/13 1100 03/23/13 0945  COLORURINE YELLOW YELLOW  LABSPEC 1.013 1.016  PHURINE 5.5 6.0  GLUCOSEU NEGATIVE NEGATIVE  HGBUR NEGATIVE NEGATIVE  BILIRUBINUR NEGATIVE NEGATIVE  KETONESUR NEGATIVE NEGATIVE  PROTEINUR NEGATIVE NEGATIVE  UROBILINOGEN 1.0 1.0  NITRITE NEGATIVE NEGATIVE  LEUKOCYTESUR NEGATIVE NEGATIVE   Micro Results: Recent Results (from the past 240 hour(s))  MRSA PCR SCREENING     Status: None   Collection Time    03/20/13  6:52 PM      Result Value Range Status   MRSA by PCR NEGATIVE  NEGATIVE Final   Comment:            The GeneXpert MRSA Assay (FDA     approved for NASAL specimens     only), is one component of a     comprehensive MRSA colonization     surveillance program. It is not     intended to diagnose MRSA     infection nor to guide or     monitor treatment for     MRSA infections.   Studies/Results: No results found. Medications: I have reviewed the patient's current medications. Scheduled Meds: . albuterol  2.5 mg Nebulization BID  . aspirin EC  81 mg  Oral Daily  . atorvastatin  80 mg Oral q1800  . carvedilol  25 mg Oral BID WC  . [START ON 03/26/2013] furosemide  40 mg Oral Daily  . heparin subcutaneous  5,000 Units Subcutaneous Q8H  . insulin aspart  0-15 Units Subcutaneous TID WC  . insulin aspart  0-5 Units Subcutaneous QHS  . insulin glargine  15 Units Subcutaneous QHS  . ipratropium  0.5 mg Nebulization BID  . levETIRAcetam  1,000 mg Oral BID  . lisinopril  5 mg Oral Daily  . pantoprazole  40 mg Oral Daily  . potassium chloride  40 mEq Oral Once  . sodium chloride  10-40 mL Intracatheter Q12H   Continuous Infusions: . sodium chloride 10 mL/hr (03/19/13 2016)   PRN Meds:.acetaminophen, albuterol, nicotine, ondansetron (ZOFRAN) IV, ondansetron, sodium chloride, traMADol  Assessment/Plan:   # Acute-on-chronic hypercapnic respiratory failure due to OHS/OSA, resolved  # Acute encephalopathy, resolved  # Obesity hypoventilation syndrome Patient was noted to have acute encephalopathy 2/2 acute on chronic hypercarbic respiratory failure due to OHS/OSA on 03/20/13. She was transferred to step down and treated with BiPAP. Acidotic,acetazolamide D/C'd 8/30, which was likely contributing to her acidosis. PCCM was consulted. PICC placed, as pt w/o access. Her mental status is back to baseline. She has been approved for SNF and will be transferred today. - tele - BiPAP at night - O2 goal to keep at 90-94%- she is satting 98% on room air -patient will need to be on CPAP at the SNF and she will need to obtain a CPAP machine at some point as an outpatient  #Hematochezia- Patient reports having blood in her stool for the past three days. This is a chronic and intermittent problem for her as she has had similar episodes for the past 5 years. She has no symptoms of anemia. She reports having had a colonoscopy at [facility] in [place] three years ago, but she doesn't remember what they found if anything. -STAT CBC showed Hb 11 (up from previous  Hb this admission) -attempting to obtain records from [facility]  -patient will need PCP f/u and will need to be scheduled for a colonoscopy as an outpatient  #Chest pain--noncardiac, no further workup indicated by cardiology. Heparin drip initially started and DC'd. Old records obtained - scanned into epic - t wave inversions on EKG present from previous 01/20/13, no other cardiac records. Troponin  and EKG unremarkable this admission.   # Acute on chronic CHF with ICD. 2D ECHO done 8/30 that showed dialted LV with mild LVH. EF 30% with diffuse hypokinesis. Grade 1 diastolic dysfunction.  - Cardiology following, appreciate input -decreased lasix to 40mg  daily from BID given rising Cr (this is expected given she has been recently started on lisinopril) - Continue Coreg and lisinopril per cardiology recommendation. - Strict I & Os  - Daily weight  -supped K today   # Epilepsy- H/o seizures, supposed to be on Keppra at home but with medication noncompliance.  Reports off medications for at least one month. CT head negative. Neurology consulted, and no further neurologic testing indicated per the recommendation - Continue Keppra  #Suicide Ideation - Pt with history of depression -psych signed off- patient no longer actively having SI/HI; no longer needs abilify; patient will follow up as an outpatient with psych as per Dr. Sheela Stack note   # Hypertension -losartan and hydralazine are on home med list, but pt notes non-compliance. BP borderline low.  - on Coreg and lisinopril  #Diabetes mellitus type II   Recent Labs Lab 03/23/13 2111 03/24/13 0618 03/24/13 1100 03/24/13 1607 03/24/13 2111  GLUCAP 140* 86 112* 165* 124*   - Lantus 15 units QHS - SSI - CBG well controlled   # Homelessness - living with niece-in-law, unable to afford medications  -consult CSW, CM   # Hyperlipidemia - chronic, stable  -start atorvastatin   # Tobacco abuse - encourage cessation  -ordered tobacco  abuse counseling  -nicotine patch daily prn   # DVT PPx - Heparin [place] and SCDs.   # Code status: Full  Dispo: Discharge today  The patient does not have a current PCP (No primary provider on file.) and does need an [facility] hospital follow-up appointment after discharge.  The patient does not have transportation limitations that hinder transportation to clinic appointments.  .Services Needed at time of discharge: Y = Yes, Blank = No PT:   Home health vs SNF  OT:   RN:   Equipment:  Rolling walker with 5" wheels  Other:     LOS: 6 days   Windell Hummingbird, MD 03/25/2013, 11:01 AM

## 2013-03-25 NOTE — Progress Notes (Signed)
Clinical Social Work Department CLINICAL SOCIAL WORK PLACEMENT NOTE 03/25/2013  Patient:  Kelly Phelps, Kelly Phelps  Account Number:  [id number] Admit date:  03/19/2013  Clinical Social Worker:  Maree Krabbe, [place]  Date/time:  03/25/2013  Clinical Social Work is seeking post-discharge placement for this patient at the following level of care:   SKILLED NURSING   (*CSW will update this form in Epic as items are completed)   03/23/2013  Patient/family provided with [facility] Department of Clinical Social Work's list of facilities offering this level of care within the geographic area requested by the patient (or if unable, by the patient's family).  03/23/2013  Patient/family informed of their freedom to choose among providers that offer the needed level of care, that participate in Medicare, Medicaid or managed care program needed by the patient, have an available bed and are willing to accept the patient.  03/23/2013  Patient/family informed of [facility]' ownership interest in [facility], as well as of the fact that they are under no obligation to receive care at this facility.  PASARR submitted to EDS on 03/23/2013 PASARR number received from EDS on 03/23/2013  FL2 transmitted to all facilities in geographic area requested by pt/family on  03/23/2013 FL2 transmitted to all facilities within larger geographic area on   Patient informed that his/her managed care company has contracts with or will negotiate with  certain facilities, including the following:     Patient/family informed of bed offers received:  03/24/2013 Patient chooses bed at [facility] [place] Physician recommends and patient chooses bed at    Patient to be transferred to [facility], [facility] on  03/25/2013 Patient to be transferred to facility by EMS  The following physician request were entered in Epic:   Additional Comments:

## 2013-03-25 NOTE — Progress Notes (Signed)
Clinical Social Worker facilitated patient discharge by contacting the patient, family and facility, [facility]. Patient agreeable to this plan and arranging transport via EMS . CSW will sign off, as social work intervention is no longer needed.  Mollyann Halbert, MSW, LCSWA [phone] 

## 2013-03-25 NOTE — Progress Notes (Addendum)
Subjective:  Laying sideways in bed. Pleasant. No SOB, no CP.   Objective:  Vital Signs in the last 24 hours: Temp:  [97.5 F (36.4 C)-98.6 F (37 C)] 98 F (36.7 C) (09/03 0308) Pulse Rate:  [63-73] 65 (09/03 0732) Resp:  [18-24] 20 (09/03 0732) BP: (109-149)/(40-91) 145/65 mmHg (09/03 0732) SpO2:  [93 %-98 %] 95 % (09/03 0308) Weight:  [123.378 kg (272 lb)] 123.378 kg (272 lb) (09/03 0308)  Intake/Output from previous day: 09/02 0701 - 09/03 0700 In: 1100 [P.O.:1080; I.V.:20] Out: 1 [Stool:1]   Physical Exam: General: Well developed, well nourished, in no acute distress. Head:  Normocephalic and atraumatic. Lungs: Clear to auscultation and percussion. Heart: Normal S1 and S2.  No murmur, rubs or gallops.  Abdomen: soft, non-tender, positive bowel sounds.Obese Extremities: No clubbing or cyanosis. Trace chronic edema. Neurologic: Alert and oriented x 3.    Lab Results: No results found for this basename: WBC, HGB, PLT,  in the last 72 hours  Recent Labs  03/24/13 0439 03/25/13 0500  NA 139 142  K 3.3* 3.4*  CL 102 103  CO2 29 29  GLUCOSE 99 126*  BUN 31* 36*  CREATININE 1.25* 1.31*   Telemetry: no adverse rhythms Personally viewed.    Cardiac Studies:  EF 15% on ECHO last year  Assessment/Plan:  Principal Problem:   Anginal chest pain at rest Active Problems:   Hypertension   Diabetes mellitus without complication   Tobacco abuse   Congestive heart failure   Acute-on-chronic respiratory failure   Obesity hypoventilation syndrome  1) Severe dilated cardiomyopathy  - appears fairly well compensated  - no changes  - BUN/creat increasing (changed to PO 40mg  BID lasix yesterday), monitor. May need further decrease to 40 QD.   - Would decrease lasix to 40mg  QD from BID with increased BUN/Creat. Willing to tolerate small increase in creat (addition of ACE-I)  2) Abnormal ECG  - same as prior in med records on shadow chart  - no changes  3) Severe sleep  apnea/hypercapnea  - old issue. Diamox used in past according to records.  4) ICD  - stable  5) Chest pain  - also chronic at times. Likely non cardiac. No complaints currently.  - no further cardiac testing  6) HL  - lipitor  7) Hypokalemia  - replete.     - Needs PCP care.  OK from CV standpoint for DC.   Shaketha Jeon 03/25/2013, 8:26 AM    Will sign off.

## 2013-03-25 NOTE — Care Management Note (Signed)
    Page 1 of 1   03/25/2013     5:24:00 PM   CARE MANAGEMENT NOTE 03/25/2013  Patient:  Kelly Phelps, Kelly Phelps   Account Number:  [id number]  Date Initiated:  03/25/2013  Documentation initiated by:  Maricus Tanzi  Subjective/Objective Assessment:   PT ADM WITH CHEST PAIN.  PTA, PT RESIDED AT HOME WITH NIECE.     Action/Plan:   CSW CONSULTED TO FACILITATE DC TO SNF, PER P.T. RECOMMENDATIONS.   Anticipated DC Date:  03/25/2013   Anticipated DC Plan:  SKILLED NURSING FACILITY  In-house referral  Clinical Social Worker      DC Planning Services  CM consult      Choice offered to / List presented to:             Status of service:  Completed, signed off Medicare Important Message given?   (If response is "NO", the following Medicare IM given date fields will be blank) Date Medicare IM given:   Date Additional Medicare IM given:    Discharge Disposition:  SKILLED NURSING FACILITY  Per UR Regulation:  Reviewed for med. necessity/level of care/duration of stay  If discussed at Long Length of Stay Meetings, dates discussed:    Comments:  03/25/13 Norine Reddington,RN,BSN [phone] PT DISCHARGING TO [facility] TODAY, PER CSW ARRANGEMENTS.

## 2013-03-25 NOTE — Progress Notes (Addendum)
CSW spoke with family who stated that they want to continue dc planning with the facility [facility]. Admissions at [facility] stated they have availability and a confirmed bed. CSW will continue with dc planning. FL2 on chart to be signed by MD.    Maree Krabbe, MSW, Theresia Majors [phone]

## 2013-03-25 NOTE — Progress Notes (Addendum)
Physical Therapy Treatment Patient Details Name: Kelly Phelps MRN: [id number] DOB: [birth year] Today's Date: 03/25/2013 Time: 4098-1191 PT Time Calculation (min): 27 min  PT Assessment / Plan / Recommendation  History of Present Illness Patient is a 67 y/o female admitted with SOB, chest tightness and pain with PMH positive for hypertension, hyperlipidemia, type 2 diabetes, and tobacco use (1 pack a week since age 84). She also has history CHF with  AICD in place since Nov of 2013.   PT Comments   Patient continues to demonstrate deficits in functional mobility as indicated below. Able to increase ambulation distance but demonstrates increased fatigue and some unsafe behaviors with use of RW. Will continue to progress activity as tolerated.  Follow Up Recommendations  SNF     Does the patient have the potential to tolerate intense rehabilitation     Barriers to Discharge        Equipment Recommendations  Rolling walker with 5" wheels    Recommendations for Other Services    Frequency Min 3X/week   Progress towards PT Goals Progress towards PT goals: Progressing toward goals  Plan Current plan remains appropriate    Precautions / Restrictions Precautions Precautions: Fall Precaution Comments: Reports 4 falls in 6 months, gets up unaided Restrictions Weight Bearing Restrictions: No   Pertinent Vitals/Pain Patient reports generalized pain "everywhere" (states arthritic)     Mobility  Bed Mobility Bed Mobility: Not assessed Supine to Sit: 4: Min assist;HOB elevated;With rails Sitting - Scoot to Edge of Bed: 5: Supervision Details for Bed Mobility Assistance: Pt up in recliner upon arrival Transfers Transfers: Sit to Stand;Stand to Sit Sit to Stand: 4: Min guard;From toilet Stand to Sit: 4: Min guard;To chair/3-in-1 Details for Transfer Assistance: VCs for safe hand placement  Ambulation/Gait Ambulation/Gait Assistance: 4: Min guard Ambulation Distance (Feet): 220  Feet Assistive device: Rolling walker Ambulation/Gait Assistance Details: Patient very slow, multiple rest breaks, VCs for proper use of assistive device. Pt demonstrates some unsafe behaviors with fatigue. Gait Pattern: Step-through pattern;Decreased stride length;Trunk flexed;Narrow base of support Gait velocity: very slow Stairs: No      PT Goals (current goals can now be found in the care plan section) Acute Rehab PT Goals Patient Stated Goal: I can't go back to where I was living, I just can't take it any more" PT Goal Formulation: With patient Time For Goal Achievement: 04/05/13 Potential to Achieve Goals: Good  Visit Information  Last PT Received On: 03/25/13 Assistance Needed: +1 History of Present Illness: Patient is a 67 y/o female admitted with SOB, chest tightness and pain with PMH positive for hypertension, hyperlipidemia, type 2 diabetes, and tobacco use (1 pack a week since age 69). She also has history CHF with  AICD in place since Nov of 2013.    Subjective Data  Subjective: I didn't sleep well at all Patient Stated Goal: I can't go back to where I was living, I just can't take it any more"   Cognition  Cognition Arousal/Alertness: Awake/alert Behavior During Therapy: WFL for tasks assessed/performed Overall Cognitive Status: Impaired/Different from baseline Area of Impairment: Safety/judgement Safety/Judgement: Decreased awareness of safety General Comments: slow processing today     Balance  Balance Balance Assessed: Yes Static Standing Balance Static Standing - Balance Support: Bilateral upper extremity supported Static Standing - Level of Assistance: 5: Stand by assistance Static Standing - Comment/# of Minutes: VCs for upright posture, patient unsafely leaning down to rest on walker bars [place] - Eyes Closed:  (feet apart  eyes closed) High Level Balance High Level Balance Comments: very slow with some instability noted  End of Session PT - End of  Session Equipment Utilized During Treatment: Gait belt Activity Tolerance: Patient tolerated treatment well Patient left: in chair;with call bell/phone within reach Nurse Communication: Mobility status   GP     Fabio Asa 03/25/2013, 1:04 PM Charlotte Crumb, PT DPT  [phone]

## 2013-04-01 ENCOUNTER — Non-Acute Institutional Stay (SKILLED_NURSING_FACILITY): Payer: Medicare Other | Admitting: Adult Health

## 2013-04-01 ENCOUNTER — Encounter: Payer: Self-pay | Admitting: Adult Health

## 2013-04-01 DIAGNOSIS — E785 Hyperlipidemia, unspecified: Secondary | ICD-10-CM

## 2013-04-01 DIAGNOSIS — E119 Type 2 diabetes mellitus without complications: Secondary | ICD-10-CM

## 2013-04-01 DIAGNOSIS — I509 Heart failure, unspecified: Secondary | ICD-10-CM

## 2013-04-01 DIAGNOSIS — E662 Morbid (severe) obesity with alveolar hypoventilation: Secondary | ICD-10-CM

## 2013-04-01 DIAGNOSIS — Z720 Tobacco use: Secondary | ICD-10-CM

## 2013-04-01 DIAGNOSIS — F17200 Nicotine dependence, unspecified, uncomplicated: Secondary | ICD-10-CM

## 2013-04-01 DIAGNOSIS — I10 Essential (primary) hypertension: Secondary | ICD-10-CM

## 2013-04-01 DIAGNOSIS — I5042 Chronic combined systolic (congestive) and diastolic (congestive) heart failure: Secondary | ICD-10-CM

## 2013-04-01 NOTE — Progress Notes (Signed)
Patient ID: Kelly Phelps, female   DOB: Feb 25, 1946, 67 y.o.   MRN: [id number] Provider:  Gwenith Spitz. Renato Gails, D.O., C.M.D. Location:  [facility]  PCP: No primary provider on file.I will be PCP while in SNF  Code Status:Full Code  Allergies  Allergen Reactions  . Hydrogen Peroxide Rash  . Iodine Rash  . Nitroglycerin Other (See Comments)    Headache only  . Penicillins Anxiety    "makes me paranoid, like I'm on marijuana"  . Phenobarbital Rash    Chief Complaint  Patient presents with  . Hospitalization Follow-up    new admission s/p hospitalization for chest pain   . Medical Managment of Chronic Issues    T2DM, HTN, hyperlipidemia, CHF, Hematochezia    HPI: 67 y.o. female admitted to [facility] living SNF s/p hospitalization for chest pain. Pt. Admitted to hospital complaining of chest tightness, with radiating pain to the left arm associated with diaphoresis. EKG and troponin were negative and cardiology advised this pain was noncardiac. On 8/29 pt. Was noted to be in hypercarbic respiratory failure d/t OHS/OSA was placed on Bipap and slowing weaned to nasal cannula then to room air. Pt. Was not using CPAP at home d/t machine being broken. Also while in the hospital pt. Was noted to have hematochezia (no symptoms of anemia). Pt. Had a colonoscopy while in [place]  but reports were unable to be obtained by hospital. Pt. States she was supposed to go to a cancer clinic on 8/8 in [place] but she moved here on 8/7 and has never followed up. Colonoscopy should be scheduled. Acute CHF exacerbation- edema in bilateral LE. Lasix was given and lisinopril added to regimen. Lorsartan was d/c . While in the hospital pt. was deemed by psych to be suicidal d/t depression, recommended abilify to be started however pt. was not medically stable in order to start this medication. Pysch f/u and stated she was no longer suicidal but recommended outpatient f/u.   BP regimen changed from losartan and  hydralazine to Coreg and Lisinopril.  T2DM- controlled under current regimen Hyperlipidemia- Atorvastatin started during hospital admission   When seen, she felt like she was doing much better.    Review of Systems  Constitutional: Negative for weight loss.  Eyes: Negative for blurred vision.  Respiratory: Positive for shortness of breath.   Cardiovascular: Positive for leg swelling. Negative for chest pain.  Gastrointestinal: Negative for abdominal pain, diarrhea and constipation.  Genitourinary: Negative for dysuria.  Musculoskeletal: Negative for falls.  Neurological: Negative for dizziness and headaches.  Psychiatric/Behavioral: Positive for depression.       States that her mood is "ok." States that she doesn't like being homeless and living out of boxes. States that she feels useless. Denies suicidal ideation, but admits that she has tried it before. Agrees to f/u with a psychiatrist (as recommended by hospital MD) but unsure about taking any medication.     Past Medical History  Diagnosis Date  . Atrial fibrillation     Pt denies history of this, unclear why this is on patient's problem list  . Hypertension   . Diabetes mellitus without complication   . CAD (coronary artery disease)     History of 2 prior MI's, the most recent of which was in 2013  . Hyperlipemia   . GERD (gastroesophageal reflux disease)   . DVT (deep venous thrombosis)     History of 1 DVT around 2012, unclear if provoked or unprovoked. Was previously on Coumadin, but  this was stopped due to a GI bleed  . Congestive heart failure     With ICD in place since 05/2010  . Epilepsy   . Obstructive sleep apnea   . Dilated cardiomyopathy     NUC - 7/13 (no significant reversibility) EF <30%, ECHO 4/13 - EF 15-20% - deemed idiopathic on prior records  . AICD (automatic cardioverter/defibrillator) present   . Hematochezia    No past surgical history on file. Social History:   reports that she has been  smoking.  She does not have any smokeless tobacco history on file. She reports that she does not drink alcohol or use illicit drugs.  Family History  Problem Relation Age of Onset  . Heart attack Daughter 17  . Heart attack Sister 85  . Heart attack Mother 6  . Renal Disease Sister     ESRD, on HD  . Heart failure Daughter     Medications: Patient's Medications  New Prescriptions   No medications on file  Previous Medications   ACETAMINOPHEN (TYLENOL) 325 MG TABLET    Take 650 mg by mouth every 6 (six) hours as needed for pain.   ASPIRIN 81 MG TABLET    Take 81 mg by mouth daily.   CARVEDILOL (COREG) 25 MG TABLET    Take 25 mg by mouth 2 (two) times daily with a meal.   DULOXETINE (CYMBALTA) 60 MG CAPSULE    Take 60 mg by mouth daily.   FUROSEMIDE (LASIX) 40 MG TABLET    Take 40 mg by mouth daily.    INSULIN GLARGINE (LANTUS) 100 UNIT/ML INJECTION    Inject 26 Units into the skin at bedtime.    LEVETIRACETAM (KEPPRA) 1000 MG TABLET    Take 1,000 mg by mouth 2 (two) times daily.   LINAGLIPTIN (TRADJENTA) 5 MG TABS TABLET    Take 5 mg by mouth daily.   OMEPRAZOLE (PRILOSEC) 20 MG CAPSULE    Take 20 mg by mouth daily.   POTASSIUM CHLORIDE (MICRO-K) 10 MEQ CR CAPSULE    Take 10 mEq by mouth 2 (two) times daily.   ROSUVASTATIN (CRESTOR) 20 MG TABLET    Take 20 mg by mouth daily.  Modified Medications   No medications on file  Discontinued Medications   No medications on file     Physical Exam: Physical Exam  Nursing note and vitals reviewed. Constitutional: She is oriented to person, place, and time. She appears well-developed and well-nourished.  Obese black female  HENT:  Head: Normocephalic and atraumatic.  Right Ear: External ear normal.  Left Ear: External ear normal.  Nose: Nose normal.  Mouth/Throat: Oropharynx is clear and moist. No oropharyngeal exudate.  Eyes: Conjunctivae and EOM are normal. Pupils are equal, round, and reactive to light.  Neck: Normal range of  motion. Neck supple. No JVD present. No tracheal deviation present. No thyromegaly present.  Cardiovascular: Normal rate, regular rhythm, normal heart sounds and intact distal pulses.   Pulmonary/Chest: Effort normal.  On O2, wears CPAP at night  Abdominal: Soft. Bowel sounds are normal. She exhibits no distension. There is no tenderness.  Musculoskeletal: She exhibits edema.  Lymphadenopathy:    She has no cervical adenopathy.  Neurological: She is alert and oriented to person, place, and time. No cranial nerve deficit.  Skin: Skin is warm and dry.  Psychiatric:  Pt. Becomes tearful when speaking about being homeless.Denies suicidal ideation    Filed Vitals:   04/01/13 1206  BP: 100/58  Pulse:  67  SpO2: 92%   Labs reviewed: Basic Metabolic Panel:  Recent Labs  16/10/96 0500 03/24/13 0439 03/25/13 0500  NA 139 139 142  K 3.5 3.3* 3.4*  CL 102 102 103  CO2 28 29 29   GLUCOSE 115* 99 126*  BUN 24* 31* 36*  CREATININE 1.06 1.25* 1.31*  CALCIUM 9.8 9.4 9.2  MG 1.9 1.8 1.8   Liver Function Tests:  Recent Labs  03/19/13 1534 03/20/13 2024  AST 16 12  ALT 7 8  ALKPHOS 52 53  BILITOT 0.4 0.3  PROT 6.6 6.4  ALBUMIN 3.2* 3.2*    Recent Labs  03/19/13 1534  LIPASE 48  CBC:  Recent Labs  03/19/13 1534 03/20/13 0510 03/20/13 2024 03/25/13 0910  WBC 4.5 4.9 4.6 4.9  NEUTROABS 2.7  --  1.7 2.0  HGB 11.0* 11.3* 10.7* 11.0*  HCT 36.0 36.9 35.3* 34.4*  MCV 84.7 86.2 87.2 83.3  PLT 140* 117* 163 154   Cardiac Enzymes:  Recent Labs  03/19/13 2236 03/20/13 0510  TROPONINI <0.30 <0.30  CBG:  Recent Labs  03/24/13 2111 03/25/13 0656 03/25/13 1106  GLUCAP 124* 124* 122*  Recent Labs: 03/31/13: WBC 4.7, Hgb 10.6, Hct 34.1, Plts 161, Na 140, K+ 4.6, CO2 34, BUN 22, Cr 0.97  Imaging and Procedures: 03/31/13: Chest x-ray: Moderate to mild cardiomegaly with mild pulmonary vascular congestion, poor inspiration secondary to the patient's condition/body habitus,  small left pleural effusion, Patchy bibasilar atelectasis or pneumonitis, no evidence of active tuberculosis 03/20/13:CT Head: 1. No evidence of acute intracranial disease. 2. Proptosis.  03/21/13:2D Echo:Moderately dilated LV with mild LV hypertrophy. EF 30% with global hypokinesis. Normal RV size and systolic function. No significant valvular abnormalities. 03/20/13: EKG: Normal sinus with prolonged QTC   Assessment/Plan 1. Hypertension -bp goal is <140/90  -cont to monitor  2. 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 Tobacco abuse -no smoking while here -discussed benefits of quitting  4. Obesity hypoventilation syndrome -use cpap as ordered -weight loss encouraged  5. Hyperlipemia --on statin therapy, cont to monitor  6. Diabetes mellitus without complication -monitor cbgs, may need adjustments while here on facility diet  7.  Depression:   -f/u with psychiatry   8.  Hematochezia:  Requires f/u cbc and eventual cscope as outpatient  Functional status:  Fairly independent normally, but here due to generalized weakness  Family/ staff Communication: discussed with pt's nurse  Labs/tests ordered: colonoscopy f/u hematochezia as outpatient when rehab complete, f/u cbc here, psych f/u

## 2013-04-06 ENCOUNTER — Encounter: Payer: Self-pay | Admitting: Adult Health

## 2013-04-06 DIAGNOSIS — I5042 Chronic combined systolic (congestive) and diastolic (congestive) heart failure: Secondary | ICD-10-CM | POA: Insufficient documentation

## 2013-04-08 ENCOUNTER — Ambulatory Visit: Payer: Medicare Other | Admitting: Cardiology

## 2013-04-14 ENCOUNTER — Encounter: Payer: Self-pay | Admitting: Internal Medicine

## 2013-04-14 ENCOUNTER — Non-Acute Institutional Stay (SKILLED_NURSING_FACILITY): Payer: Medicare Other | Admitting: Internal Medicine

## 2013-04-14 DIAGNOSIS — G4733 Obstructive sleep apnea (adult) (pediatric): Secondary | ICD-10-CM | POA: Insufficient documentation

## 2013-04-14 DIAGNOSIS — I428 Other cardiomyopathies: Secondary | ICD-10-CM

## 2013-04-14 DIAGNOSIS — E119 Type 2 diabetes mellitus without complications: Secondary | ICD-10-CM

## 2013-04-14 DIAGNOSIS — I420 Dilated cardiomyopathy: Secondary | ICD-10-CM

## 2013-04-14 DIAGNOSIS — G40909 Epilepsy, unspecified, not intractable, without status epilepticus: Secondary | ICD-10-CM

## 2013-04-14 DIAGNOSIS — I509 Heart failure, unspecified: Secondary | ICD-10-CM

## 2013-04-14 DIAGNOSIS — E785 Hyperlipidemia, unspecified: Secondary | ICD-10-CM

## 2013-04-14 DIAGNOSIS — J9620 Acute and chronic respiratory failure, unspecified whether with hypoxia or hypercapnia: Secondary | ICD-10-CM

## 2013-04-14 DIAGNOSIS — I5042 Chronic combined systolic (congestive) and diastolic (congestive) heart failure: Secondary | ICD-10-CM

## 2013-04-14 DIAGNOSIS — I10 Essential (primary) hypertension: Secondary | ICD-10-CM

## 2013-04-14 DIAGNOSIS — Z95810 Presence of automatic (implantable) cardiac defibrillator: Secondary | ICD-10-CM

## 2013-04-14 NOTE — Progress Notes (Signed)
MRN: [id number] Name: Kelly Phelps  Sex: female Age: 67 y.o. DOB: 07/01/1946  PSC #: Ronni Rumble Facility/Room: 201 Level Of Care: SNF Provider: Merrilee Seashore D Emergency Contacts: Extended Emergency Contact Information Primary Emergency Contact: Wyline Beady of [place] Home Phone: [phone] Work Phone: [phone] Mobile Phone: [phone] Relation: Niece  Code Status: FULL  Allergies: Hydrogen peroxide; Iodine; Nitroglycerin; Penicillins; and Phenobarbital  Chief Complaint  Patient presents with  . Discharge Note    HPI: Patient is 67 y.o. female who is being discharged to home.  Past Medical History  Diagnosis Date  . Atrial fibrillation     Pt denies history of this, unclear why this is on patient's problem list  . Hypertension   . Diabetes mellitus without complication   . CAD (coronary artery disease)     History of 2 prior MI's, the most recent of which was in 2013  . Hyperlipemia   . GERD (gastroesophageal reflux disease)   . DVT (deep venous thrombosis)     History of 1 DVT around 2012, unclear if provoked or unprovoked. Was previously on Coumadin, but this was stopped due to a GI bleed  . Congestive heart failure     With ICD in place since 05/2010  . Epilepsy   . Obstructive sleep apnea   . Dilated cardiomyopathy     NUC - 7/13 (no significant reversibility) EF <30%, ECHO 4/13 - EF 15-20% - deemed idiopathic on prior records  . AICD (automatic cardioverter/defibrillator) present   . Hematochezia     No past surgical history on file.    Medication List       This list is accurate as of: 04/14/13  5:46 PM.  Always use your most recent med list.               acetaminophen 325 MG tablet  Commonly known as:  TYLENOL  Take 650 mg by mouth every 6 (six) hours as needed for pain.     aspirin 81 MG tablet  Take 81 mg by mouth daily.     carvedilol 25 MG tablet  Commonly known as:  COREG  Take 25 mg by mouth 2 (two) times  daily with a meal.     DULoxetine 60 MG capsule  Commonly known as:  CYMBALTA  Take 60 mg by mouth daily.     furosemide 40 MG tablet  Commonly known as:  LASIX  Take 40 mg by mouth daily.     insulin glargine 100 UNIT/ML injection  Commonly known as:  LANTUS  Inject 26 Units into the skin at bedtime.     levETIRAcetam 1000 MG tablet  Commonly known as:  KEPPRA  Take 1,000 mg by mouth 2 (two) times daily.     linagliptin 5 MG Tabs tablet  Commonly known as:  TRADJENTA  Take 5 mg by mouth daily.     omeprazole 20 MG capsule  Commonly known as:  PRILOSEC  Take 20 mg by mouth daily.     potassium chloride 10 MEQ CR capsule  Commonly known as:  MICRO-K  Take 10 mEq by mouth 2 (two) times daily.     rosuvastatin 20 MG tablet  Commonly known as:  CRESTOR  Take 20 mg by mouth daily.        No orders of the defined types were placed in this encounter.     There is no immunization history on file for this patient.  History  Substance Use Topics  .  Smoking status: Current Every Day Smoker -- 0.20 packs/day for 56 years  . Smokeless tobacco: Not on file  . Alcohol Use: No     Comment: History of alcohol abuse, quit in 1986     Filed Vitals:   04/14/13 1724  BP: 119/48  Pulse: 74  Temp: 98.2 F (36.8 C)  Resp: 20    Physical Exam  GENERAL APPEARANCE: Alert, conversant. Appropriately groomed. No acute distress.  SKIN: No diaphoresis rash,  RESPIRATORY: Breathing is even, unlabored. Lung sounds are clear   CARDIOVASCULAR: Heart RRR no murmurs, rubs or gallops. trace peripheral edema.   GASTROINTESTINAL: Abdomen is soft, non-tender, not distended w/ normal bowel sounds MUSCULOSKELETAL: No abnormal joints or musculature NEUROLOGIC: Oriented X3. Cranial nerves 2-12 grossly intact. Moves all extremities no tremor.   Patient Active Problem List   Diagnosis Date Noted  . Obstructive sleep apnea   . Chronic combined systolic and diastolic congestive heart failure  04/06/2013  . Dilated cardiomyopathy   . AICD (automatic cardioverter/defibrillator) present   . Acute-on-chronic respiratory failure 03/21/2013  . Obesity hypoventilation syndrome 03/21/2013  . Anginal chest pain at rest 03/19/2013  . Tobacco abuse 03/19/2013  . Hypertension   . Diabetes mellitus without complication   . MI (myocardial infarction)   . Hyperlipemia   . Epilepsy   . Congestive heart failure     CBC    Component Value Date/Time   WBC 4.9 03/25/2013 0910   RBC 4.13 03/25/2013 0910   HGB 11.0* 03/25/2013 0910   HCT 34.4* 03/25/2013 0910   PLT 154 03/25/2013 0910   MCV 83.3 03/25/2013 0910   LYMPHSABS 2.1 03/25/2013 0910   MONOABS 0.7 03/25/2013 0910   EOSABS 0.2 03/25/2013 0910   BASOSABS 0.0 03/25/2013 0910    CMP     Component Value Date/Time   NA 142 03/25/2013 0500   K 3.4* 03/25/2013 0500   CL 103 03/25/2013 0500   CO2 29 03/25/2013 0500   GLUCOSE 126* 03/25/2013 0500   BUN 36* 03/25/2013 0500   CREATININE 1.31* 03/25/2013 0500   CALCIUM 9.2 03/25/2013 0500   PROT 6.4 03/20/2013 2024   ALBUMIN 3.2* 03/20/2013 2024   AST 12 03/20/2013 2024   ALT 8 03/20/2013 2024   ALKPHOS 53 03/20/2013 2024   BILITOT 0.3 03/20/2013 2024   GFRNONAA 41* 03/25/2013 0500   GFRAA 47* 03/25/2013 0500    Assessment and Plan  Pt is being d/c to home with home health care, with PT/OT, walker, shower bench, reacher and C pap. She will also get a social work consult.All of her chronic problems have been stable on her current medications.She did not meet criteria for home O2.   Margit Hanks, MD

## 2014-04-14 ENCOUNTER — Non-Acute Institutional Stay (INDEPENDENT_AMBULATORY_CARE_PROVIDER_SITE_OTHER): Payer: Medicare Other | Admitting: Family Medicine

## 2014-04-14 DIAGNOSIS — Z593 Problems related to living in residential institution: Secondary | ICD-10-CM

## 2014-04-14 NOTE — Progress Notes (Addendum)
Patient ID: Kelly Phelps, female   DOB: [birth year], 68 y.o.   MRN: [id number] Opened note to add discharge date to NH information.

## 2015-05-14 IMAGING — CT CT HEAD W/O CM
1 series · 16 of 30 positions shown, 20 images · non-contrast
Comparison: None.

CLINICAL DATA: Acute encephalopathy.  History of epilepsy.

CT HEAD WITHOUT CONTRAST
TECHNIQUE: Contiguous axial images were obtained from the base of
the skull through the vertex without contrast.

[Series 2: head 5.0 h30s · axial · 0.43mm/px · z∈[-170,-20]mm · 16 of 34 slices shown, 20 images]
[im 2/34  brain]
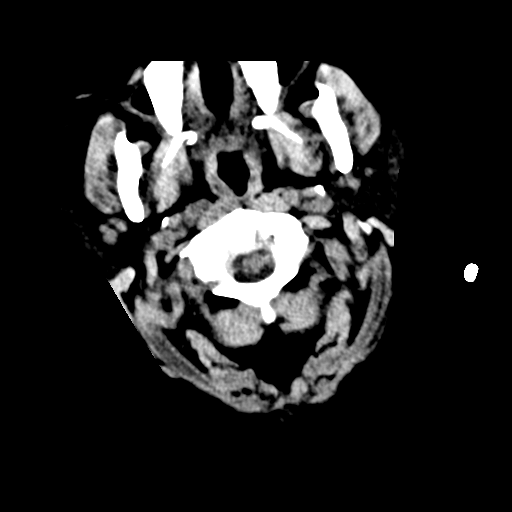
[im 2/34  bone]
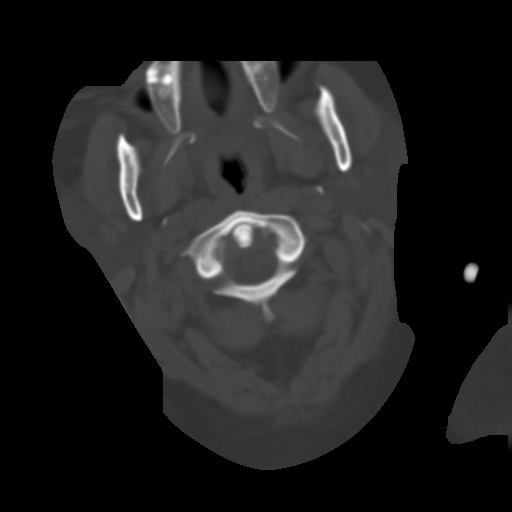
[im 4/34  brain]
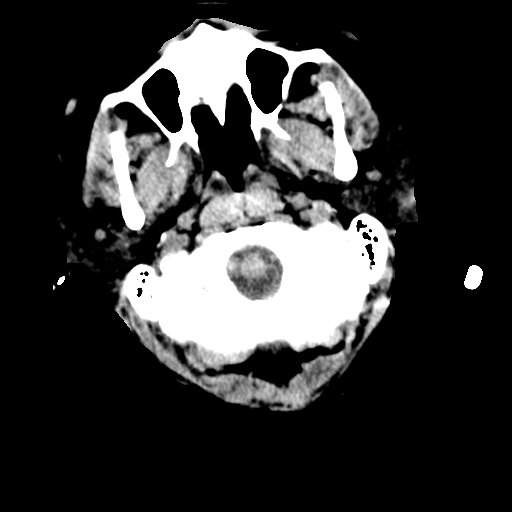
[im 6/34  brain]
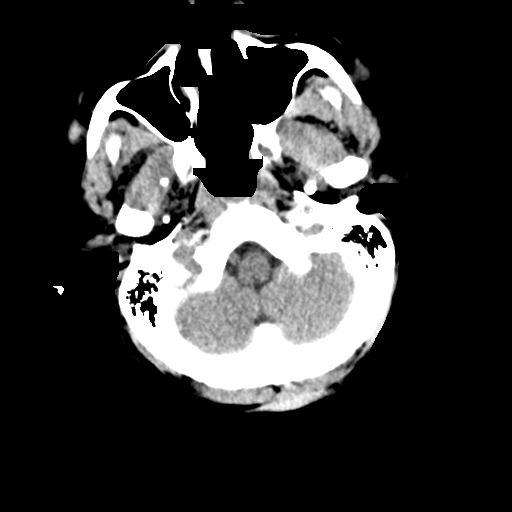
[im 8/34  brain]
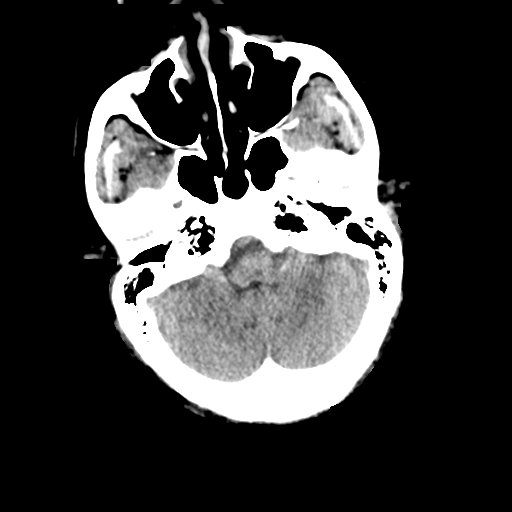
[im 10/34  brain]
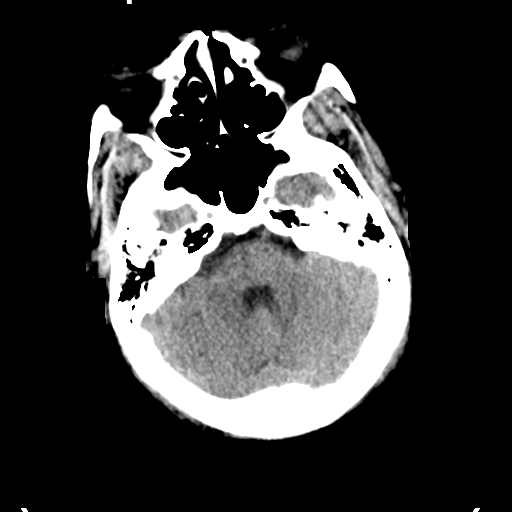
[im 10/34  bone]
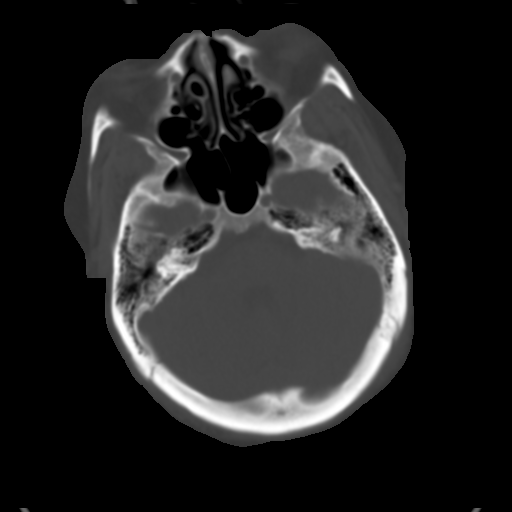
[im 12/34  brain]
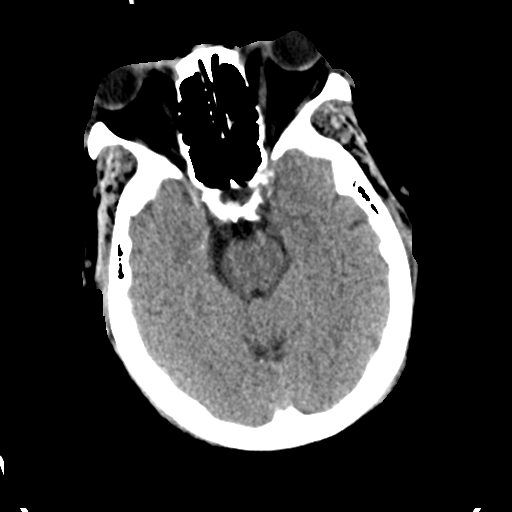
[im 14/34  brain]
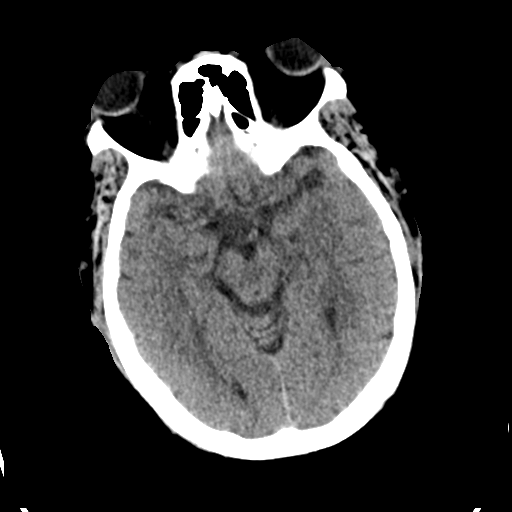
[im 16/34  brain]
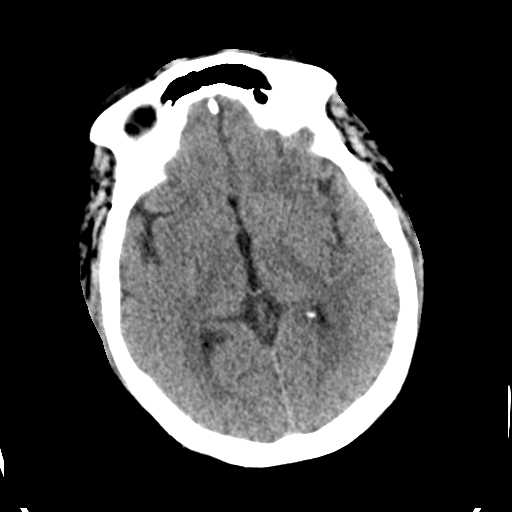
[im 18/34  brain]
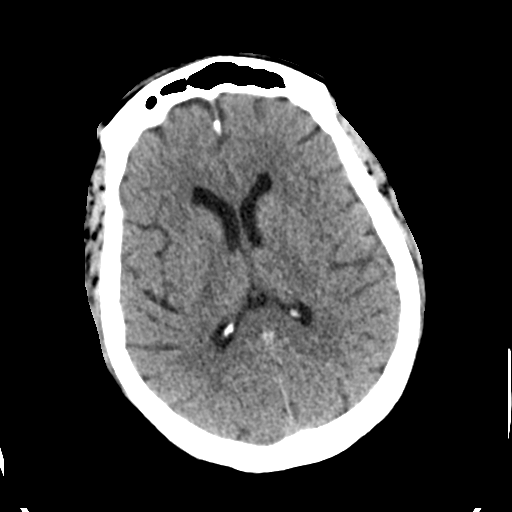
[im 18/34  bone]
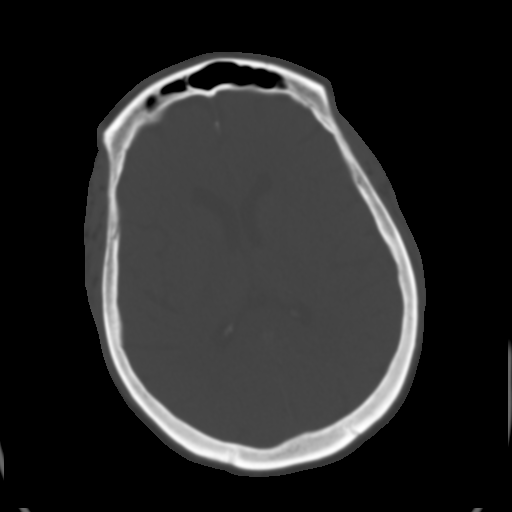
[im 20/34  brain]
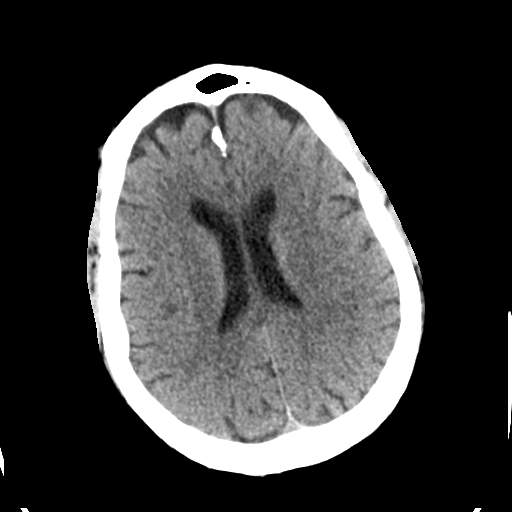
[im 22/34  brain]
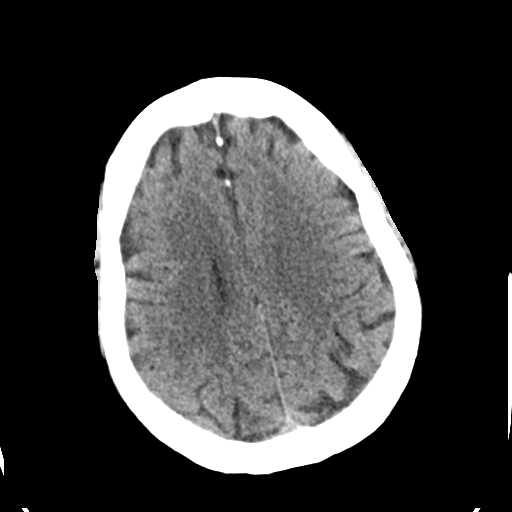
[im 24/34  brain]
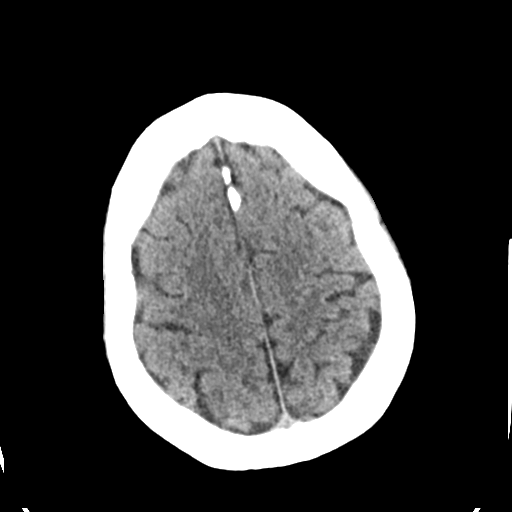
[im 26/34  brain]
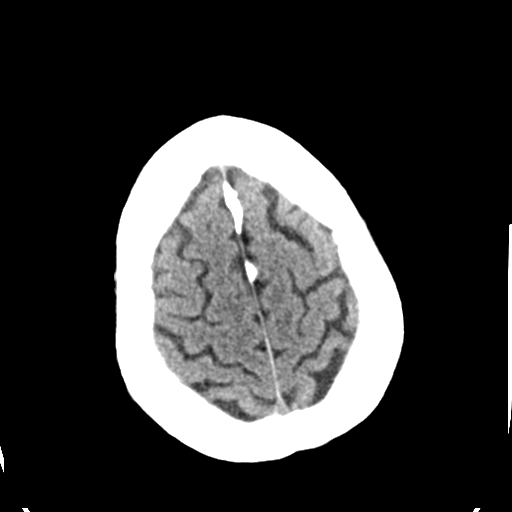
[im 26/34  bone]
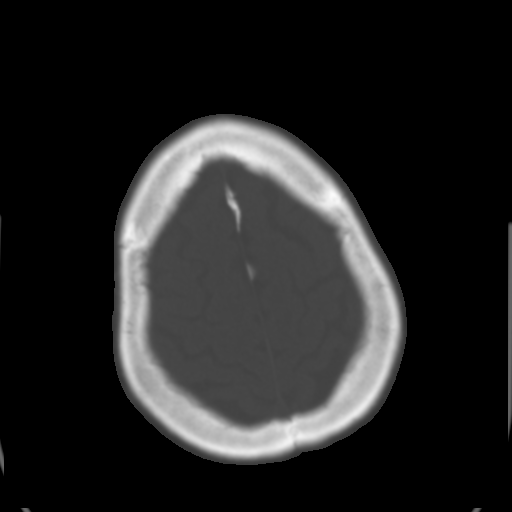
[im 28/34  brain]
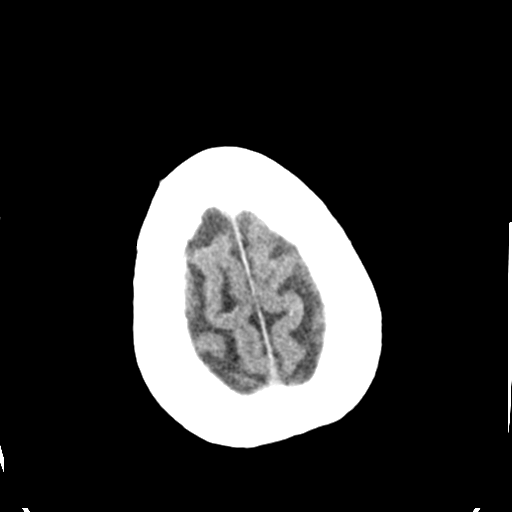
[im 30/34  brain]
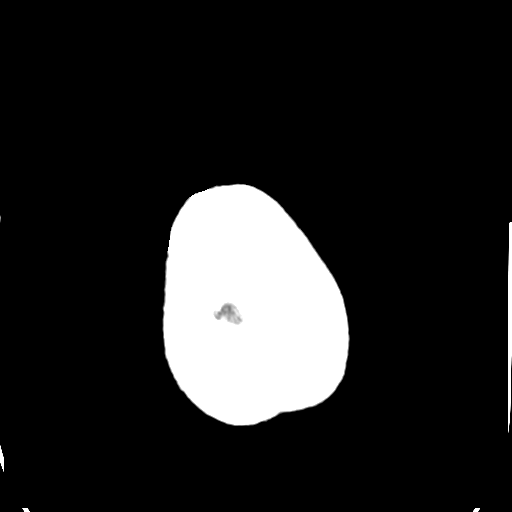
[im 32/34  brain]
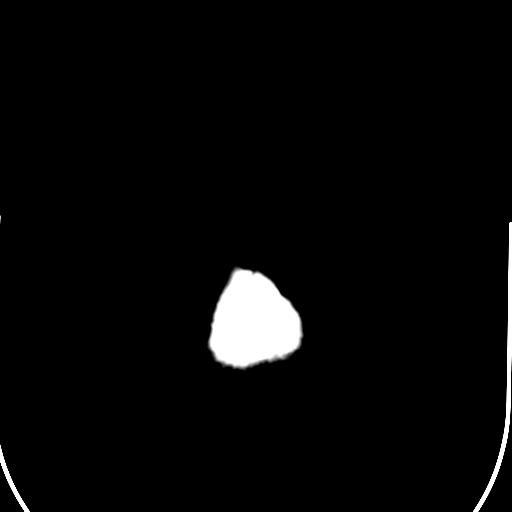

[16 of 30 positions shown; findings below may reference images not displayed]

FINDINGS: Skull:No acute osseous abnormality.

Orbits: Symmetric proptosis.  There is related herniation of the
lacrimal glands.  There is slight to enlargement of the medial
recti relative to the lateral recti, raising the possibility of
Grave's orbitopathy.  Bilateral cataract resection.

Brain: No evidence of acute abnormality, such as acute infarction,
hemorrhage, hydrocephalus, or mass lesion/mass effect.
IMPRESSION: 1.  No evidence of acute intracranial disease.
2.  Proptosis.

## 2015-05-15 IMAGING — CR DG CHEST 1V PORT
1 series · 1 of 1 positions shown · non-contrast
Comparison: Single view of the chest 03/19/2013.

CLINICAL DATA: PICC placement.

PORTABLE CHEST - 1 VIEW

[AP]
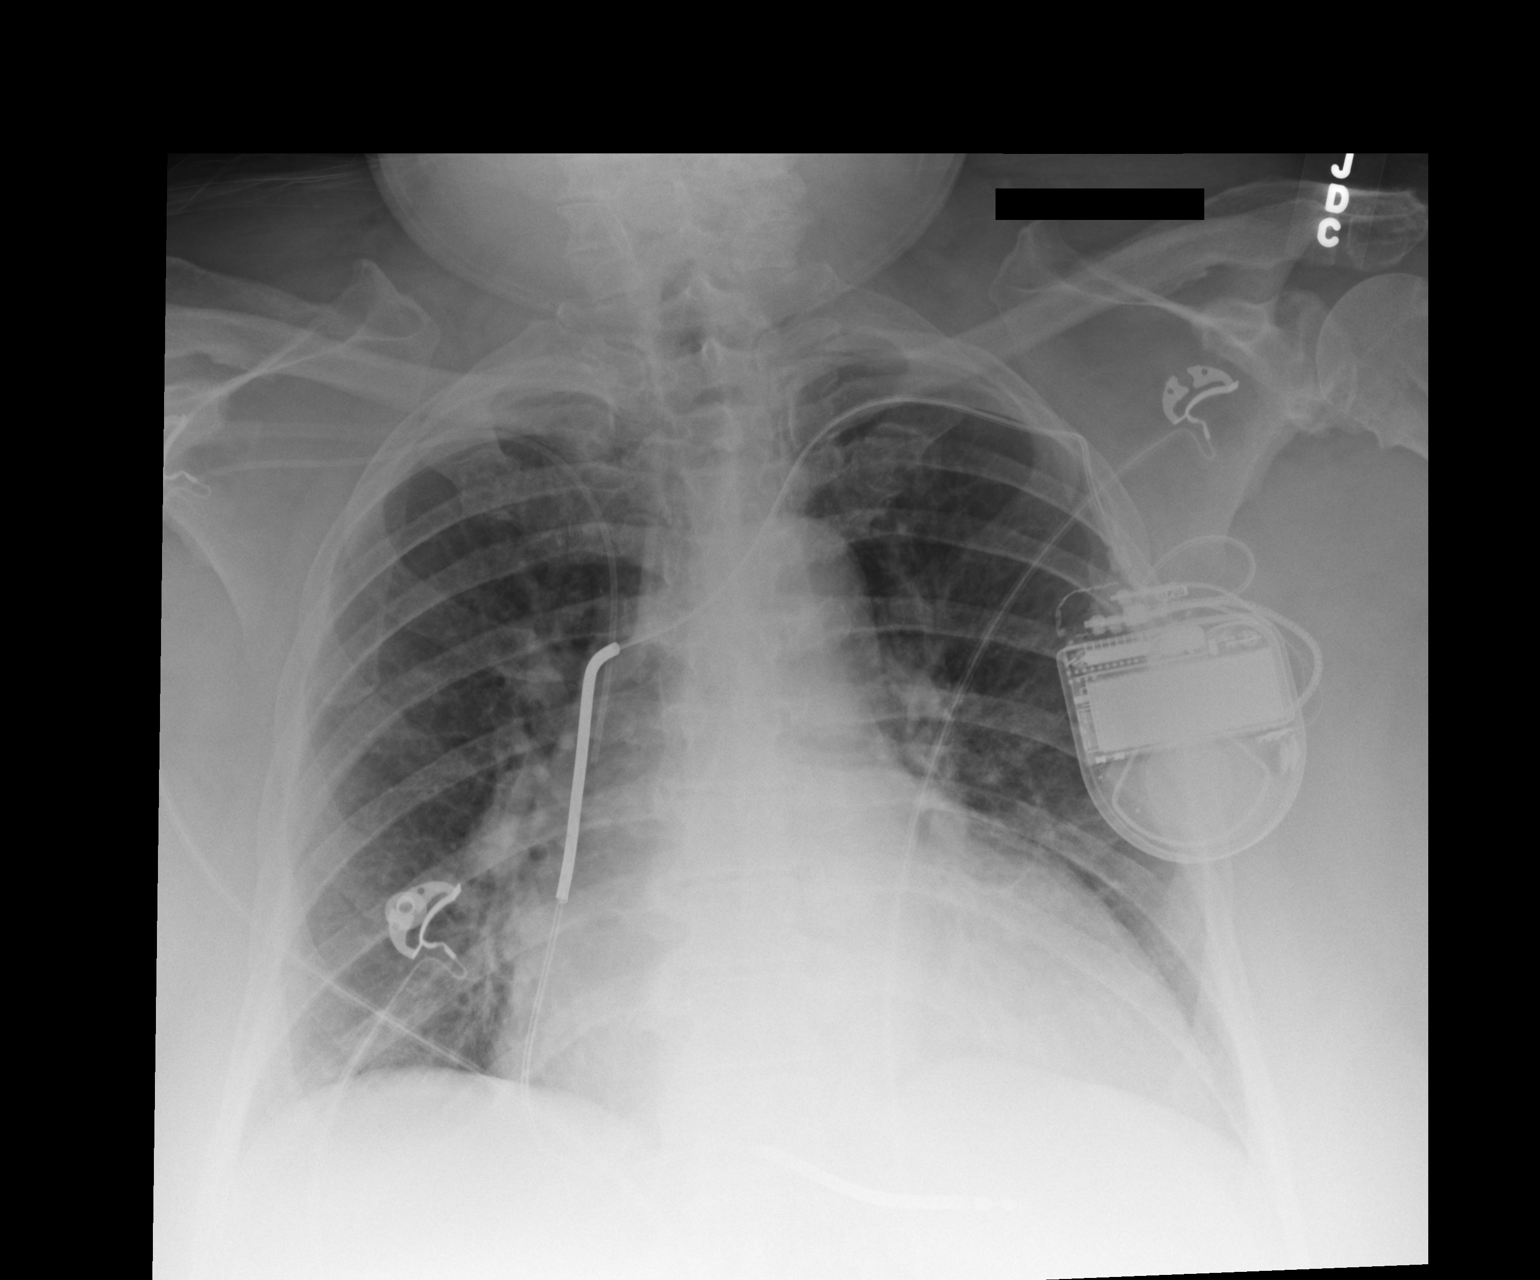

[1 of 1 positions shown; findings below may reference images not displayed]

FINDINGS: Right PICC is in place the tip projecting over the lower
superior vena cava.  There is cardiomegaly without edema.  Lungs
are clear.  No pneumothorax or pleural fluid.  AICD noted.
IMPRESSION: 1.  Tip of right PICC projects over the lower superior vena cava.
2.  Cardiomegaly without acute disease.
